# Patient Record
Sex: Female | Born: 1980 | Race: White | Hispanic: No | Marital: Married | Smoking: Never smoker
Health system: Southern US, Community
[De-identification: ages and names within clinical notes are randomized; demographics above are authoritative.]

## PROBLEM LIST (undated history)

## (undated) ENCOUNTER — Inpatient Hospital Stay (HOSPITAL_COMMUNITY): Payer: Self-pay

## (undated) ENCOUNTER — Emergency Department: Admission: EM | Payer: 59 | Source: Home / Self Care

## (undated) DIAGNOSIS — F329 Major depressive disorder, single episode, unspecified: Secondary | ICD-10-CM

## (undated) DIAGNOSIS — J45909 Unspecified asthma, uncomplicated: Secondary | ICD-10-CM

## (undated) DIAGNOSIS — R87629 Unspecified abnormal cytological findings in specimens from vagina: Secondary | ICD-10-CM

## (undated) DIAGNOSIS — G43909 Migraine, unspecified, not intractable, without status migrainosus: Secondary | ICD-10-CM

## (undated) DIAGNOSIS — F32A Depression, unspecified: Secondary | ICD-10-CM

## (undated) DIAGNOSIS — IMO0002 Reserved for concepts with insufficient information to code with codable children: Secondary | ICD-10-CM

## (undated) DIAGNOSIS — K219 Gastro-esophageal reflux disease without esophagitis: Secondary | ICD-10-CM

## (undated) HISTORY — PX: NASAL SINUS SURGERY: SHX719

## (undated) HISTORY — DX: Gastro-esophageal reflux disease without esophagitis: K21.9

## (undated) HISTORY — DX: Migraine, unspecified, not intractable, without status migrainosus: G43.909

## (undated) HISTORY — DX: Reserved for concepts with insufficient information to code with codable children: IMO0002

## (undated) HISTORY — DX: Depression, unspecified: F32.A

## (undated) HISTORY — DX: Major depressive disorder, single episode, unspecified: F32.9

---
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
## 2000-07-09 ENCOUNTER — Encounter: Payer: Self-pay | Admitting: Family Medicine

## 2000-07-09 ENCOUNTER — Encounter: Admission: RE | Admit: 2000-07-09 | Discharge: 2000-07-09 | Payer: Self-pay | Admitting: Family Medicine

## 2000-08-15 ENCOUNTER — Other Ambulatory Visit: Admission: RE | Admit: 2000-08-15 | Discharge: 2000-08-15 | Payer: Self-pay | Admitting: Family Medicine

## 2000-11-01 ENCOUNTER — Other Ambulatory Visit: Admission: RE | Admit: 2000-11-01 | Discharge: 2000-11-01 | Payer: Self-pay | Admitting: Gynecology

## 2001-02-19 ENCOUNTER — Inpatient Hospital Stay (HOSPITAL_COMMUNITY): Admission: AD | Admit: 2001-02-19 | Discharge: 2001-02-19 | Payer: Self-pay | Admitting: Gynecology

## 2001-05-02 ENCOUNTER — Encounter (INDEPENDENT_AMBULATORY_CARE_PROVIDER_SITE_OTHER): Payer: Self-pay

## 2001-05-02 ENCOUNTER — Inpatient Hospital Stay (HOSPITAL_COMMUNITY): Admission: AD | Admit: 2001-05-02 | Discharge: 2001-05-04 | Payer: Self-pay | Admitting: Gynecology

## 2001-05-05 ENCOUNTER — Encounter: Admission: RE | Admit: 2001-05-05 | Discharge: 2001-05-08 | Payer: Self-pay | Admitting: Gynecology

## 2001-05-13 ENCOUNTER — Encounter: Admission: RE | Admit: 2001-05-13 | Discharge: 2001-06-12 | Payer: Self-pay | Admitting: Gynecology

## 2001-06-12 ENCOUNTER — Other Ambulatory Visit: Admission: RE | Admit: 2001-06-12 | Discharge: 2001-06-12 | Payer: Self-pay | Admitting: Gynecology

## 2002-03-04 ENCOUNTER — Encounter: Payer: Self-pay | Admitting: Emergency Medicine

## 2002-03-04 ENCOUNTER — Emergency Department (HOSPITAL_COMMUNITY): Admission: EM | Admit: 2002-03-04 | Discharge: 2002-03-04 | Payer: Self-pay | Admitting: Emergency Medicine

## 2002-07-09 ENCOUNTER — Other Ambulatory Visit: Admission: RE | Admit: 2002-07-09 | Discharge: 2002-07-09 | Payer: Self-pay | Admitting: Gynecology

## 2002-11-12 ENCOUNTER — Other Ambulatory Visit: Admission: RE | Admit: 2002-11-12 | Discharge: 2002-11-12 | Payer: Self-pay | Admitting: Gynecology

## 2002-11-28 ENCOUNTER — Observation Stay (HOSPITAL_COMMUNITY): Admission: AD | Admit: 2002-11-28 | Discharge: 2002-11-28 | Payer: Self-pay | Admitting: Gynecology

## 2002-12-04 ENCOUNTER — Encounter: Admission: RE | Admit: 2002-12-04 | Discharge: 2003-03-04 | Payer: Self-pay | Admitting: Gynecology

## 2003-05-03 ENCOUNTER — Inpatient Hospital Stay (HOSPITAL_COMMUNITY): Admission: AD | Admit: 2003-05-03 | Discharge: 2003-05-03 | Payer: Self-pay | Admitting: Gynecology

## 2003-05-04 ENCOUNTER — Inpatient Hospital Stay (HOSPITAL_COMMUNITY): Admission: AD | Admit: 2003-05-04 | Discharge: 2003-05-04 | Payer: Self-pay | Admitting: Gynecology

## 2003-05-15 ENCOUNTER — Inpatient Hospital Stay (HOSPITAL_COMMUNITY): Admission: AD | Admit: 2003-05-15 | Discharge: 2003-05-15 | Payer: Self-pay | Admitting: Gynecology

## 2003-05-18 ENCOUNTER — Observation Stay (HOSPITAL_COMMUNITY): Admission: AD | Admit: 2003-05-18 | Discharge: 2003-05-18 | Payer: Self-pay | Admitting: Internal Medicine

## 2003-05-28 ENCOUNTER — Inpatient Hospital Stay (HOSPITAL_COMMUNITY): Admission: AD | Admit: 2003-05-28 | Discharge: 2003-05-30 | Payer: Self-pay | Admitting: Gynecology

## 2003-07-09 ENCOUNTER — Other Ambulatory Visit: Admission: RE | Admit: 2003-07-09 | Discharge: 2003-07-09 | Payer: Self-pay | Admitting: Gynecology

## 2003-07-21 ENCOUNTER — Emergency Department (HOSPITAL_COMMUNITY): Admission: EM | Admit: 2003-07-21 | Discharge: 2003-07-22 | Payer: Self-pay | Admitting: Emergency Medicine

## 2003-10-19 ENCOUNTER — Ambulatory Visit (HOSPITAL_BASED_OUTPATIENT_CLINIC_OR_DEPARTMENT_OTHER): Admission: RE | Admit: 2003-10-19 | Discharge: 2003-10-19 | Payer: Self-pay | Admitting: Otolaryngology

## 2003-10-19 ENCOUNTER — Ambulatory Visit (HOSPITAL_COMMUNITY): Admission: RE | Admit: 2003-10-19 | Discharge: 2003-10-19 | Payer: Self-pay | Admitting: Otolaryngology

## 2003-10-19 ENCOUNTER — Encounter (INDEPENDENT_AMBULATORY_CARE_PROVIDER_SITE_OTHER): Payer: Self-pay | Admitting: Specialist

## 2003-12-19 HISTORY — PX: OTHER SURGICAL HISTORY: SHX169

## 2004-03-22 ENCOUNTER — Ambulatory Visit (HOSPITAL_COMMUNITY): Admission: RE | Admit: 2004-03-22 | Discharge: 2004-03-22 | Payer: Self-pay | Admitting: Gastroenterology

## 2004-05-31 ENCOUNTER — Ambulatory Visit (HOSPITAL_COMMUNITY): Admission: RE | Admit: 2004-05-31 | Discharge: 2004-05-31 | Payer: Self-pay | Admitting: Gastroenterology

## 2004-07-06 ENCOUNTER — Encounter: Admission: RE | Admit: 2004-07-06 | Discharge: 2004-07-06 | Payer: Self-pay | Admitting: General Surgery

## 2004-07-15 ENCOUNTER — Inpatient Hospital Stay (HOSPITAL_COMMUNITY): Admission: EM | Admit: 2004-07-15 | Discharge: 2004-07-18 | Payer: Self-pay | Admitting: Emergency Medicine

## 2004-07-21 ENCOUNTER — Other Ambulatory Visit: Admission: RE | Admit: 2004-07-21 | Discharge: 2004-07-21 | Payer: Self-pay | Admitting: Gynecology

## 2004-08-11 ENCOUNTER — Inpatient Hospital Stay (HOSPITAL_COMMUNITY): Admission: RE | Admit: 2004-08-11 | Discharge: 2004-08-13 | Payer: Self-pay | Admitting: General Surgery

## 2004-08-18 DIAGNOSIS — IMO0002 Reserved for concepts with insufficient information to code with codable children: Secondary | ICD-10-CM

## 2004-08-18 HISTORY — DX: Reserved for concepts with insufficient information to code with codable children: IMO0002

## 2004-09-12 ENCOUNTER — Emergency Department (HOSPITAL_COMMUNITY): Admission: EM | Admit: 2004-09-12 | Discharge: 2004-09-12 | Payer: Self-pay | Admitting: Emergency Medicine

## 2004-09-14 ENCOUNTER — Encounter: Admission: RE | Admit: 2004-09-14 | Discharge: 2004-09-14 | Payer: Self-pay | Admitting: General Surgery

## 2004-11-22 ENCOUNTER — Ambulatory Visit: Payer: Self-pay | Admitting: Critical Care Medicine

## 2004-12-07 ENCOUNTER — Ambulatory Visit: Payer: Self-pay | Admitting: Pulmonary Disease

## 2004-12-23 ENCOUNTER — Other Ambulatory Visit: Admission: RE | Admit: 2004-12-23 | Discharge: 2004-12-23 | Payer: Self-pay | Admitting: Gynecology

## 2005-01-18 ENCOUNTER — Ambulatory Visit: Payer: Self-pay | Admitting: Internal Medicine

## 2005-01-26 ENCOUNTER — Ambulatory Visit: Payer: Self-pay | Admitting: Adult Health

## 2005-03-20 ENCOUNTER — Ambulatory Visit: Payer: Self-pay | Admitting: Internal Medicine

## 2005-03-21 ENCOUNTER — Emergency Department (HOSPITAL_COMMUNITY): Admission: EM | Admit: 2005-03-21 | Discharge: 2005-03-21 | Payer: Self-pay | Admitting: Emergency Medicine

## 2005-03-28 ENCOUNTER — Encounter: Admission: RE | Admit: 2005-03-28 | Discharge: 2005-03-28 | Payer: Self-pay | Admitting: General Surgery

## 2005-04-13 ENCOUNTER — Ambulatory Visit: Payer: Self-pay | Admitting: Professional

## 2005-04-20 ENCOUNTER — Ambulatory Visit: Payer: Self-pay | Admitting: Professional

## 2005-04-27 ENCOUNTER — Ambulatory Visit: Payer: Self-pay | Admitting: Professional

## 2005-05-04 ENCOUNTER — Ambulatory Visit: Payer: Self-pay | Admitting: Professional

## 2005-05-18 ENCOUNTER — Ambulatory Visit: Payer: Self-pay | Admitting: Professional

## 2005-05-24 ENCOUNTER — Ambulatory Visit: Payer: Self-pay | Admitting: Pulmonary Disease

## 2005-05-25 ENCOUNTER — Ambulatory Visit: Payer: Self-pay | Admitting: Professional

## 2005-06-08 ENCOUNTER — Ambulatory Visit: Payer: Self-pay | Admitting: Professional

## 2005-06-15 ENCOUNTER — Ambulatory Visit: Payer: Self-pay | Admitting: Professional

## 2005-06-16 ENCOUNTER — Other Ambulatory Visit: Admission: RE | Admit: 2005-06-16 | Discharge: 2005-06-16 | Payer: Self-pay | Admitting: Gynecology

## 2005-06-22 ENCOUNTER — Ambulatory Visit: Payer: Self-pay | Admitting: Professional

## 2005-06-26 ENCOUNTER — Ambulatory Visit: Payer: Self-pay | Admitting: Professional

## 2005-06-29 ENCOUNTER — Ambulatory Visit: Payer: Self-pay | Admitting: Professional

## 2005-07-13 ENCOUNTER — Ambulatory Visit: Payer: Self-pay | Admitting: Professional

## 2005-07-20 ENCOUNTER — Ambulatory Visit: Payer: Self-pay | Admitting: Professional

## 2005-07-22 ENCOUNTER — Emergency Department (HOSPITAL_COMMUNITY): Admission: EM | Admit: 2005-07-22 | Discharge: 2005-07-22 | Payer: Self-pay | Admitting: Emergency Medicine

## 2005-07-27 ENCOUNTER — Ambulatory Visit: Payer: Self-pay | Admitting: Professional

## 2005-07-31 ENCOUNTER — Ambulatory Visit (HOSPITAL_COMMUNITY): Admission: RE | Admit: 2005-07-31 | Discharge: 2005-07-31 | Payer: Self-pay | Admitting: Family Medicine

## 2005-08-03 ENCOUNTER — Ambulatory Visit: Payer: Self-pay | Admitting: Professional

## 2005-08-09 ENCOUNTER — Ambulatory Visit: Payer: Self-pay | Admitting: Pulmonary Disease

## 2005-08-17 ENCOUNTER — Ambulatory Visit: Payer: Self-pay | Admitting: Professional

## 2005-08-24 ENCOUNTER — Ambulatory Visit: Payer: Self-pay | Admitting: Professional

## 2005-08-31 ENCOUNTER — Ambulatory Visit: Payer: Self-pay | Admitting: Professional

## 2005-09-07 ENCOUNTER — Ambulatory Visit: Payer: Self-pay | Admitting: Professional

## 2005-10-23 ENCOUNTER — Emergency Department (HOSPITAL_COMMUNITY): Admission: EM | Admit: 2005-10-23 | Discharge: 2005-10-23 | Payer: Self-pay | Admitting: *Deleted

## 2006-08-08 ENCOUNTER — Emergency Department (HOSPITAL_COMMUNITY): Admission: EM | Admit: 2006-08-08 | Discharge: 2006-08-08 | Payer: Self-pay | Admitting: Emergency Medicine

## 2006-11-11 ENCOUNTER — Emergency Department (HOSPITAL_COMMUNITY): Admission: EM | Admit: 2006-11-11 | Discharge: 2006-11-11 | Payer: Self-pay | Admitting: Emergency Medicine

## 2006-11-12 ENCOUNTER — Emergency Department (HOSPITAL_COMMUNITY): Admission: EM | Admit: 2006-11-12 | Discharge: 2006-11-12 | Payer: Self-pay | Admitting: Emergency Medicine

## 2008-02-25 ENCOUNTER — Other Ambulatory Visit: Admission: RE | Admit: 2008-02-25 | Discharge: 2008-02-25 | Payer: Self-pay | Admitting: Gynecology

## 2008-03-13 ENCOUNTER — Emergency Department (HOSPITAL_COMMUNITY): Admission: EM | Admit: 2008-03-13 | Discharge: 2008-03-13 | Payer: Self-pay | Admitting: Emergency Medicine

## 2008-10-07 ENCOUNTER — Emergency Department: Payer: Self-pay | Admitting: Emergency Medicine

## 2008-12-11 ENCOUNTER — Emergency Department (HOSPITAL_COMMUNITY): Admission: EM | Admit: 2008-12-11 | Discharge: 2008-12-11 | Payer: Self-pay | Admitting: Emergency Medicine

## 2009-04-22 ENCOUNTER — Other Ambulatory Visit: Admission: RE | Admit: 2009-04-22 | Discharge: 2009-04-22 | Payer: Self-pay | Admitting: Gynecology

## 2009-04-22 ENCOUNTER — Encounter: Payer: Self-pay | Admitting: Gynecology

## 2009-04-22 ENCOUNTER — Ambulatory Visit: Payer: Self-pay | Admitting: Gynecology

## 2009-05-28 ENCOUNTER — Emergency Department (HOSPITAL_COMMUNITY): Admission: EM | Admit: 2009-05-28 | Discharge: 2009-05-29 | Payer: Self-pay | Admitting: Emergency Medicine

## 2009-07-13 ENCOUNTER — Ambulatory Visit: Payer: Self-pay | Admitting: Gynecology

## 2009-07-19 ENCOUNTER — Ambulatory Visit: Payer: Self-pay | Admitting: Gynecology

## 2009-11-16 ENCOUNTER — Ambulatory Visit: Payer: Self-pay | Admitting: Gynecology

## 2010-04-04 ENCOUNTER — Emergency Department (HOSPITAL_COMMUNITY): Admission: EM | Admit: 2010-04-04 | Discharge: 2010-04-04 | Payer: Self-pay | Admitting: Emergency Medicine

## 2011-01-08 ENCOUNTER — Encounter: Payer: Self-pay | Admitting: General Surgery

## 2011-04-22 ENCOUNTER — Emergency Department (HOSPITAL_COMMUNITY)
Admission: EM | Admit: 2011-04-22 | Discharge: 2011-04-22 | Disposition: A | Discharge status: Home / Self Care | Payer: BC Managed Care – PPO | Attending: Emergency Medicine | Admitting: Emergency Medicine

## 2011-04-22 DIAGNOSIS — R51 Headache: Secondary | ICD-10-CM | POA: Insufficient documentation

## 2011-04-22 DIAGNOSIS — J029 Acute pharyngitis, unspecified: Secondary | ICD-10-CM | POA: Insufficient documentation

## 2011-04-22 DIAGNOSIS — J45909 Unspecified asthma, uncomplicated: Secondary | ICD-10-CM | POA: Insufficient documentation

## 2011-04-22 DIAGNOSIS — I7300 Raynaud's syndrome without gangrene: Secondary | ICD-10-CM | POA: Insufficient documentation

## 2011-05-05 NOTE — Op Note (Signed)
NAME:  [name], BERGEMANN                        ACCOUNT NO.:  [id number]   MEDICAL RECORD NO.:  [id number]                   PATIENT TYPE:  AMB   LOCATION:  ENDO                                 FACILITY:  [facility]   PHYSICIAN:  James L. Malon Kindle., M.D.          DATE OF BIRTH:  03-31-1981   DATE OF PROCEDURE:  03/22/2004  DATE OF DISCHARGE:                                 OPERATIVE REPORT   PROCEDURE PERFORMED:  Esophagogastroduodenoscopy.   MEDICATIONS:  Cetacaine spray, fentanyl 75 mcg, Versed 6 mg IV.   ENDOSCOPIST:  Llana Aliment. Randa Evens, M.D.   INDICATIONS FOR PROCEDURE:  Persistent esophageal reflux symptoms as well as  sinus drainage.  She had had previous surgery.  She had a pH probe that  showed some increased reflux episodes with a DeMeester score of 11.  This  was done while on proton pump inhibitors.  She has continued postnasal drip  and some heartburn.  She has been on Prilosec since she was 14.  This is  done in anticipation of possible consideration of an antireflux procedure.   DESCRIPTION OF PROCEDURE:  The procedure had been explained to the patient  and consent obtained.  With the patient in left lateral decubitus position,  the Olympus scope was inserted and advanced.  The stomach was entered.  The  pylorus identified and passed.  The duodenum including the bulb and second  portion were seen well and were unremarkable.  The scope was withdrawn back  into the stomach.  The  pyloric channel, antrum and body of the stomach were  normal.  The fundus and cardia were seen well on the retroflex view and were  normal.  The diaphragmatic hiatus was seen right at the gastroesophageal  junction.  There was no significant hiatal hernia but a widely patent GE  junction.  This was located at 39 cm from the teeth.  The distal and  proximal esophagus were endoscopically normal.  The scope was withdrawn.  The patient tolerated the procedure well.   ASSESSMENT:  Probable  gastroesophageal reflux disease, 530.81.  Unclear if  this is causing all of her symptoms or some of them are due to allergies.   PLAN:  Will continue to treat aggressively for reflux with twice daily  proton pump inhibitors, give reflux instruction sheet and see back in the  office in six to eight weeks and discuss the alternatives with her.                                               James L. Malon Kindle., M.D.    Waldron Session  D:  03/22/2004  T:  03/23/2004  Job:  [id number]   cc:   Duwayne Heck L. Mahaffey, M.D.  [address]  [place]  [place] [address]  Fax: [phone]  Jessica Priest, M.D.  [address]  [place] [address]  Fax: [phone]   Jefry H. Pollyann Kennedy, M.D.  [address] [place]  [place] [address]  Fax: [phone]

## 2011-05-05 NOTE — Consult Note (Signed)
   NAMEHELMI, [name]                        ACCOUNT NO.:  [id number]   MEDICAL RECORD NO.:  [id number]                   PATIENT TYPE:  INP   LOCATION:  9163                                 [facility]   PHYSICIAN:  Juan H. Lily Peer, M.D.             DATE OF BIRTH:  [birth year]   DATE OF CONSULTATION:  05/18/2003  DATE OF DISCHARGE:  05/18/2003                                   CONSULTATION   HISTORY OF PRESENT ILLNESS:  The patient is a 30 year old gravida 2, para 1,  with an estimated date of confinement of June __________ , 2004, currently  [redacted] weeks gestation, who presented to the [facility] early this morning  after midnight complaining of contractions.  They were every four to five  minutes apart for several hours.  She was placed on a monitor and was found  to be contracting every five to 10 minutes apart, and it was felt that she  was very uncomfortable, a reassuring fetal heart rate tracing.  She stated  that in the office she had been told that her cervix had been 2 cm, 50%  effaced, and at a -3 station.   She was brought in and was allowed to ambulate.  She was monitored  throughout the night with contractions __________ subsided and were very  infrequent and mild, one every 10-15 minutes apart with a reassuring fetal  heart rate tracing.   PHYSICAL EXAMINATION:  VITAL SIGNS:  Her vital signs were as follows:  Blood  pressure 150/80, pulse 83, temperature 98.4 degrees, respirations 20.  PELVIC:  The cervix was long and closed and posterior.  She has had no risk  factors reported during this pregnancy.   DISPOSITION:  She is scheduled to come to the office next Wednesday for her  routine OB visit.  Labor instructions were provided, if the contractions  were to pick up and increase in frequency, or if she had ruptured membranes,  she will report immediately to the hospital.                                                Gaetano Hawthorne. Lily Peer, M.D.    JHF/MEDQ  D:  05/18/2003  T:  05/18/2003  Job:  [id number]

## 2011-05-05 NOTE — Discharge Summary (Signed)
[facility]  Patient:    [name], [name]                     MRN: [id number] Adm. Date:  [id number] Disc. Date: [id number] Attending:  Tonye Royalty Dictator:   Antony Contras, N.P.                           Discharge Summary  DISCHARGE DIAGNOSES:          Intrauterine pregnancy at 64 2/7 weeks, PPROM, history of recurrent urinary tract infections during current pregnancy.  PROCEDURE:                    Normal spontaneous vaginal delivery of viable infant over intact perineum.  HISTORY OF PRESENT ILLNESS:   Patient is an 30 year old prima gravida with questionable LMP, [facility] May 28, 2001 by ultrasound.  Prenatal course was complicated by recurrent urinary tract infections for which she was appropriately treated.  PRENATAL LABORATORIES:        Blood type A+.  Antibody screen negative.  RPR, HBSAG, HIV nonreactive.  HOSPITAL COURSE:              Patient presented on May 02, 2001 with spontaneous rupture of membranes at 36 2/7 weeks.  Cervix was 3 cm, 80%, and -2 station.  She also did have some elevated blood pressures on admission which did normalize after her epidural anesthesia was administered.  She did progress to complete dilatation and was delivered of an Apgar 9/10 female infant weighing 5 pounds 12 ounces over an intact perineum.  Postpartum she remained afebrile, had no difficulty voiding, and was able to be discharged in satisfactory condition on her second postpartum day.  LABORATORIES:                 CBC:  Hematocrit 32.1, hemoglobin 10.7, WBC 13.3, platelets 21.5.  Urine culture was also obtained prior to discharge.  DISPOSITION:                  Follow-up in six weeks.  Continue prenatal vitamins and iron.  Motrin and Tylox for pain. DD:  05/20/01 TD:  05/21/01 Job: [id number]

## 2011-05-05 NOTE — Op Note (Signed)
NAME:  [name], [name]                        ACCOUNT NO.:  [id number]   MEDICAL RECORD NO.:  [id number]                   PATIENT TYPE:  AMB   LOCATION:  ENDO                                 FACILITY:  [facility]   PHYSICIAN:  James L. Malon Kindle., M.D.          DATE OF BIRTH:  Apr 09, 1981   DATE OF PROCEDURE:  05/31/2004  DATE OF DISCHARGE:  05/31/2004                                 OPERATIVE REPORT   PROCEDURE:  Esophageal manometry.   INDICATION:  Procedure is done to evaluate for possible antireflux  procedure.  Patient has had previous endoscopy and had a 24-hour probe with  a normal amount of reflux but the probe was done while on medications.  This  was in ear, nose and throat office.  Their interpretation was that there was  reflux in the proximal probe which was abnormal.   DESCRIPTION OF PROCEDURE:  The procedure was performed in the [facility]  manometry lab in the usual manner, no provocative studies were performed.  Results are as follows.  1. LES appears to relax nearly to the baseline with swallowing as only     residual pressure of 7.9 mm, which is normal, although the percent     relaxation was listed at 53%.  Mean LES pressure was 21.5, which is     normal.  2.  Esophageal body mean amplitude in the distal esophagus was     124 mm, mean duration of 2.7 seconds.  All wet swallows presented for     evaluation showed normal peristalsis.  2. Upper esophageal sphincter relaxes with swallow.   ASSESSMENT:  Essentially normal manometry.   PLAN:  The patient will see surgeon to discuss possible antireflux operation  with her.                                               James L. Malon Kindle., M.D.    Waldron Session  D:  07/02/2004  T:  07/03/2004  Job:  [id number]   cc:   Enrigue Catena H. Pollyann Kennedy, M.D.  [address] [place]  [place] [address]  Fax: [phone]   Jessica Priest, M.D.  [address]  [place] [address]  Fax: [phone]   [facility]  [place]  [address]  [place], [place] [address]   Angelia Mould. Derrell Lolling, M.D.  [address]  [place]  [place] [address]  Fax: [phone]

## 2011-05-05 NOTE — Discharge Summary (Signed)
   NAMEKINDRED, HEYING                        ACCOUNT NO.:  [id number]   MEDICAL RECORD NO.:  [id number]                   PATIENT TYPE:  INP   LOCATION:  9109                                 [facility]   PHYSICIAN:  Ivor Costa. Farrel Gobble, M.D.              DATE OF BIRTH:  1981/11/14   DATE OF ADMISSION:  05/28/2003  DATE OF DISCHARGE:  05/30/2003                                 DISCHARGE SUMMARY   DISCHARGE DIAGNOSIS:  Intrauterine pregnancy at 39 weeks with spontaneous  rupture of membranes.   PROCEDURE:  Vaginal delivery with a viable infant with no episiotomy.   HISTORY OF PRESENT ILLNESS:  A 30 year old gravida 2, para 1 at 45 weeks  with spontaneous rupture of membranes who presented in labor at 2 cm.  She  was augmented with Pitocin.  She had no prenatal complications.   PRENATAL LABORATORIES:  A positive, antibody negative, serology nonreactive,  Rubella titer positive, hepatitis nonreactive, HIV nonreactive, and group B  Streptococcus negative.   HOSPITAL COURSE AND TREATMENT:  She presented in labor on May 28, 2003,  with spontaneous rupture of membranes, clear fluid, was augmented with  Pitocin, delivered a viable female with Apgars of 8 and 9, birth weight of 6  pounds 10 ounces, cord pH of 7.18.  Postpartum course, she remained  afebrile, voiding, lochia decreasing.  She was discharged home in  satisfactory condition on her second postpartum day.   DISCHARGE LABORATORIES:  White count 9.1, hemoglobin 10.5, hematocrit 31.8,  platelets 168.   DISPOSITION:  She was discharged to home with instructions to follow up in  six weeks or as needed, continue prenatal vitamins, ________ discharge  booklet, and a prescription for Vioxx 25 mg daily was given.     Davonna Belling. Young, N.P.                      Ivor Costa. Farrel Gobble, M.D.    Providence Lanius  D:  06/18/2003  T:  06/18/2003  Job:  [id number]

## 2011-05-05 NOTE — H&P (Signed)
[facility]  Patient:    [name], [name]                     MRN: [id number] Adm. Date:  [id number] Attending:  Tonye Royalty Dictator:   Gaetano Hawthorne. Lily Peer, M.D.                         History and Physical  CHIEF COMPLAINT:              Preterm premature rupture of membranes at 36-2/7ths weeks gestation.  HISTORY:                      The patient is an 30 year old gravida 1, para 0, with a corrected estimated date of confinement of June 11, based on an early ultrasound, who presented to [facility] this morning stating she had the spontaneous rupture of membranes between 0330 and 0400 hours today.  On admission her temperature was 97.5 and there was evidence of gross rupture of membranes and her cervix was 2 cm, 80% and -2.  Her blood pressure initially was 144/101, repeat was 133/82 and 135/76, respectively; respiration was 20 and pulse was 106 and then 101.  Fetal heart rates running in the 120-130 beat per minute range with a reassuring fetal heart rate tracing with contractions every 2-4 minutes apart.  Once she was admitted to labor and delivery her blood pressure on arrival to the floor was 141/101, but later when she was at rest her blood pressure came down to 135/65.  She also had 2+ pitting edema but no clonus, no right upper quadrant pain, no visual disturbances or unusual headaches.  The patients prenatal course otherwise had been significant for recurring urinary tract infections for which she was treated appropriately.  PAST MEDICAL HISTORY:         The patient denies any allergies.  She has been on oral contraceptive pills prior to this pregnancy.  She has had a history of clinical depression in the past.  No other medical problems reported.  REVIEW OF SYSTEMS:            See [place] form.  PHYSICAL EXAMINATION:  VITAL SIGNS:                  As mentioned above.  HEENT:                         Unremarkable.  NECK:                         Supple, trachea midline.  No carotid bruits or thyromegaly.  LUNGS:                        Clear to auscultation without rhonchi, rales or wheezes.  HEART:                        Regular rate and rhythm, no murmurs or gallops.  BREASTS:                      Breast exam done during the first trimester was reported to be normal.  ABDOMEN:                      Gravid uterus, vertex presentation by Thayer Ohm  maneuver, positive fetal heart tones.  PELVIC:                       Cervix 3-4 cm, 80% effaced, -2 station with gross rupture of membranes with clear fluid.  EXTREMITIES:                  DTRs 2+, negative clonus, 2+ pitting edema.  PRENATAL LABORATORIES:        Only lab data available from the office is a blood type of A positive, negative antibody screen, VDRL was nonreactive, rubella was immune, hepatitis B surface and HIV were negative.  ADMITTING LABORATORIES:       Chemistry: Hemoglobin and hematocrit of 11.6 and 35.2, respectively, with a platelet count of 212,000.  Her LDH, uric acid and comprehensive metabolic panel were all normal.  Her urine did demonstrate evidence of proteinuria.  ASSESSMENT:                   An 30 year old gravida 1, para 0, at 36-2/7ths weeks, with preterm premature rupture of membranes at approximately 0400 hours.  Her cervix is 3-4 cm dilated, 80% effaced, a -2 station with irregular contractions, reassuring fetal heart rate tracing.  The patient with GBS culture negative.  The patient will have an epidural placed and once this is in place, will proceed then with Pitocin augmentation.  Will continue to monitor her blood pressures closely; if any signs of persistent elevation, will initiate magnesium sulfate at that time.  PLAN:                         1. Anticipate a vaginal delivery.                               2. As per assessment above. DD:  05/02/01 TD:  05/02/01 Job: [id number]

## 2011-05-05 NOTE — Op Note (Signed)
[name], ALTLAND                        ACCOUNT NO.:  [id number]   MEDICAL RECORD NO.:  [id number]                   PATIENT TYPE:  OBV   LOCATION:  0344                                 [facility]   PHYSICIAN:  Angelia Mould. Derrell Lolling, M.D.             DATE OF BIRTH:  [birth year]   DATE OF PROCEDURE:  08/10/2004  DATE OF DISCHARGE:                                 OPERATIVE REPORT   PREOPERATIVE DIAGNOSIS:  Gastroesophageal reflux disease.   POSTOPERATIVE DIAGNOSIS:  Gastroesophageal reflux disease.   OPERATION PERFORMED:  Laparoscopic Nissen fundoplication.   SURGEON:  Dr. Claud Kelp   FIRST ASSISTANT:  Dr. Luretha Murphy   OPERATIVE INDICATIONS:  This is a 30 year old white female, who has had  reflux symptoms for greater than 10 years.  She has been on prescription  medications since age 47.  She continues to have substernal burning  discomfort, bloating, sore throats, and voice change.  She had remained  symptomatic despite double-dose proton pump inhibitors and Reglan.  She had  a 24-hour pH probe while on proton pump inhibitors and had numerous reflux  episodes but because she was on proton pump inhibitors, the DeMeester score  was only 11.5.  She had upper endoscopy which showed a normal esophagus,  stomach, and duodenum but GE junction was very widely patent.  She had a  manometry which showed normal peristalsis and good relaxation, and she had  an upper GI which showed very minimal hiatal hernia but some reflux.  She  was felt to be a good candidate for an elective Nissen fundoplication.  That  was offered to her, and she wanted to have that done at this time.  She is  brought to the operating room electively.   OPERATIVE TECHNIQUE:  Following the induction of general endotracheal  anesthesia, the patient's abdomen was prepped and draped in a sterile  fashion.  A 10 mm OptiView port was placed in the right rectus sheath about  2 cm above the umbilicus.  This  was an atraumatic entry, and a  pneumoperitoneum was created.  Visualization was carried out.  The liver  looked normal.  The stomach and esophageal hiatus looked normal.  The spleen  looked normal.  The small bowel and large bowel looked normal.  The  gallbladder looked normal.   A 5 mm trocar was placed in the subxiphoid region, and we inserted a liver  retractor and held up the left lobe of the liver, and this provided good  visualization.  The 10 mm trocar was placed in the left subcostal region,  and two 10 mm trocars were placed in the right upper quadrant.  We elevated  the cardia of the stomach and dissected the gastrohepatic omentum away using  the harmonic scalpel.  We dissected the areolar tissue off of the anterior  surface of the esophagus and took down some of the peritoneal attachments on  the  left side of the left crus as well.  We then dissected the right crus  away from the esophagus using blunt dissection and the harmonic scalpel, and  that went well.  We had good visualization of the posterior vagus nerve and  the esophagus, and we were able to lift these up and create a window.  We  then took down some of the short gastric vessels on the upper part of the  greater curvature of the stomach and the fundus.  We had one bleeding vessel  off of the stomach, one of the short gastric vessels caused some bleeding,  and we probably lost 30-40 mL of blood up there but controlled this with  metal clips and Surgicel and pressure.  At the completion of the case, this  was completely hemostatic.  We continued to dissect the fundus of the  stomach completely away from the left crus and had excellent mobilization.  Once we had checked for bleeding around the hilum and spleen, everything  looked fine.  We did note that a small tip of the upper pole of the spleen  looked like it had infarcted.  We did not consider this to be a serious  problem.   We had the right and left crura thus  completely dissected out.  We closed  the crura with two interrupted sutures of 0 [place].  We then had Dr.  Rica Mast pass a #50 [place] lighted Bougie through the esophagus into the  stomach.  That was atraumatic.  The esophagus was small, and we did not  think it would tolerate a large Bougie.  We then passed the fundus of the  stomach, behind the esophagus from left to right and then found a suitable  piece of fundus on the left side and found that we could do a very loose  floppy fundoplication.  We pulled the Bougie back a little bit to allow  suturing of the esophagus.  The fundoplication was created with three  interrupted sutures of 0 [place], placed about 1 cm apart.  Each of these  sutures took a full-thickness bite of fundus on the left, small bite of  esophagus anteriorly, and a full-thickness bite of fundus on the right.  These were individually placed, tied, marked with metal clips, and cut.  After the completion of this, the fundoplication again looked quite loose,  and the dilator was removed.  We checked for bleeding everywhere, and  everything was completely hemostatic.  We took photographs of the crural  closure and the fundoplication.  The trocars were removed under direct  vision.  There was no bleeding from the trocar sites.  The pneumoperitoneum  was released.  Skin incisions were closed with skin staples and Steri-  Strips.  Clean bandages were placed and the patient taken to recovery room  in stable condition.  Estimated blood loss was about 40-50 mL.  Complications were none.  Sponge, needle, and instrument counts were  correct.                                               Angelia Mould. Derrell Lolling, M.D.    HMI/MEDQ  D:  08/10/2004  T:  08/10/2004  Job:  [id number]   cc:   Fayrene Fearing L. Malon Kindle., M.D.  [address]  [place]  [place] [address]  Fax:  [phone]   Danielle L. Mahaffey, M.D.  [address]  [place]  [place] [address] Fax: [phone]   Jeannett Senior. Pollyann Kennedy, M.D.  [address] [place]  [place] [address]  Fax: [phone]   Jessica Priest, M.D.  [address]  [place] [address]  Fax: [phone]

## 2011-05-05 NOTE — Discharge Summary (Signed)
NAMEJORDY, [name]                        ACCOUNT NO.:  [id number]   MEDICAL RECORD NO.:  [id number]                   PATIENT TYPE:  INP   LOCATION:  0349                                 FACILITY:  [facility]   PHYSICIAN:  Corinna L. Lendell Caprice, MD             DATE OF BIRTH:  February 08, 1981   DATE OF ADMISSION:  07/15/2004  DATE OF DISCHARGE:  07/18/2004                                 DISCHARGE SUMMARY   DIAGNOSES:  1. Viral meningitis.  2. Chronic urticaria.  3. Raynaud's disease.  4. Severe gastroesophageal reflux disease.   DISCHARGE MEDICATIONS:  1. Continue her home medications, please see H&P for details.  2. May take Vicodin 1-2 p.o. q.4h p.r.n. pain or Tylenol p.r.n.  3. Phenergan 12.5 mg p.o. q.6h p.r.n. pain.   ACTIVITY:  She may return to class and work in a week, otherwise ad lib.   DIET:  No restrictions on diet.   CONDITION ON DISCHARGE:  Stable.   FOLLOW UP:  With primary care physician as needed.   CONSULTATIONS:  None.   PROCEDURE:  Lumbar puncture.   LABORATORY DATA:  IGG was detected, no IGM.  EBV was detected. Mono screen  negative.  Urine culture negative.  CSF culture negative.  Urinalysis:  greater than 80 ketones, negative nitrite, negative leukocyte esterase,  negative blood.  CSF protein was 27 and CSF glucose was 56.  Cell count on  the CSF showed 11 white cells with 46% neutrophils, 51% lymphocytes.  CBC  was unremarkable.  Complete metabolic panel unremarkable.  CT of head was  negative.   HOSPITAL COURSE:  [name] is a 30 year old white female who presented to  the emergency room with the worst headache of her life.  She had been having  low-grade fevers for several days.  She had neck pain, chills, myalgias,  nausea, sore throat and her child was ill.  She had a lumbar puncture done  under fluoroscopic guidance and was felt to have viral meningitis.  She was  vomiting and in a lot of pain and was therefore admitted.  She was  given several  doses of empiric Rocephin pending results but this was stopped  once it became clear that patient did not have bacterial meningitis.  At the  time of discharge patient felt much better, she was eating, she was  afebrile, ambulating and able to be discharged to home.                                               Corinna L. Lendell Caprice, MD    CLS/MEDQ  D:  07/27/2004  T:  07/28/2004  Job:  [id number]   cc:   Duwayne Heck L. Mahaffey, M.D.  [address]  [place]  [place] [address]  Fax: [phone]

## 2011-05-05 NOTE — Op Note (Signed)
NAME:  [name], [name]                        ACCOUNT NO.:  [id number]   MEDICAL RECORD NO.:  [id number]                   PATIENT TYPE:  AMB   LOCATION:  DSC                                  FACILITY:  [facility]   PHYSICIAN:  Jefry H. Pollyann Kennedy, M.D.                DATE OF BIRTH:  10-08-1981   DATE OF PROCEDURE:  10/19/2003  DATE OF DISCHARGE:                                 OPERATIVE REPORT   REFERRING PHYSICIAN:  Danielle L. Mahaffey, M.D.   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   PROCEDURE:  1. Bilateral endoscopic anterior ethmoidectomy.  2. Bilateral endoscopic maxillary antrostomy, removal of polyps and cyst     from the right side.  3. Partial middle turbinate resection, right side.   SURGEON:  Jefry H. Pollyann Kennedy, M.D.   ANESTHESIA:  General endotracheal anesthesia was used.   COMPLICATIONS:  No complications.   ESTIMATED BLOOD LOSS:  50 mL.   FINDINGS:  Severe narrowed nasal passageways bilaterally.  Enlargement of  the right middle turbinate with obstruction of the infundibulum.  Multiple  polyps/mucus cysts, right maxillary sinus, and mild diffuse chronic  inflammatory mucosal changes, bilateral ethmoid sinuses.  The patient  tolerated the procedure well, was awakened, extubated and transferred to  recovery in stable condition.   HISTORY:  This is a 30 year old with a history of recurring sinusitis.  Risks, benefits, alternatives and complications to the procedure were  explained to the patient, who seemed to understand and agreed to surgery.   PROCEDURE:  The patient was taken to the operating room and placed on the  operating table in a supine position.  Following induction of general  endotracheal anesthesia, the patient was prepped and draped in a standard  fashion.   Oxymetazoline spray was used preoperatively in the nasal cavities.  Xylocaine 1% with epinephrine was infiltrated into the superior and  posterior attachments of the middle turbinates and lateral nasal wall.  Afrin-soaked pledgets were then placed in the middle meatus bilaterally.   #1 - BILATERAL ENDOSCOPIC ANTERIOR ETHMOIDECTOMY:  Using a combination of a  0-degree and 30-degree nasal endoscopes, an anterior ethmoidectomy was  performed.  The uncinate process was taken down with a sickle knife  initially.  Suction was used to break into and to break down divisions  between the ethmoid bullae and the remaining anterior cells.  The micro-  debrider was used to perform the ethmoidectomy, removing all bony partitions  and hyperplastic mucosal tissue.  A lateral limit of dissection was the  lamina papyracea, which was kept intact.  Superiorly, the fovea was kept  intact as well.  The ground lamella was kept intact, as well as the  posterior cells were not entered.   #2 - BILATERAL MAXILLARY ANTROSTOMY WITH  REMOVAL OF TISSUE FROM THE RIGHT  SIDE:  Using 30-degree nasal endoscopes, the natural ostium was identified  bilaterally and enlarged using a backbiting forcep and the through-cut  forceps.  On the left side, the sinus was clear.  On the right side, the  sinus was filled with multiple cystic/polypoid mucosal masses which were  removed using curette and suction.  The right middle turbinate was taken  down using straight through-cut forceps, removing the anterior and inferior  aspects.  The superior attachment was kept intact.  The sinus cavities were  irrigated with saline, suctioned of all secretions and blood and irrigant  and packed with Kennedy packs bilaterally and saturated with the local  anesthetic solution.  The pharynx was suctioned of blood and secretions.  The patient was then awakened, extubated and transferred to recovery.                                                Jefry H. Pollyann Kennedy, M.D.    JHR/MEDQ  D:  10/19/2003  T:  10/19/2003  Job:  [id number]   cc:   Duwayne Heck L. Mahaffey, M.D.  [address]  [place]  [place] [address]  Fax: [phone]

## 2011-05-05 NOTE — H&P (Signed)
[name], [name]                        ACCOUNT NO.:  [id number]   MEDICAL RECORD NO.:  [id number]                   PATIENT TYPE:  INP   LOCATION:  0101                                 FACILITY:  [facility]   PHYSICIAN:  Melissa L. Ladona Ridgel, MD               DATE OF BIRTH:  1981-01-25   DATE OF ADMISSION:  07/15/2004  DATE OF DISCHARGE:                                HISTORY & PHYSICAL   PRIMARY CARE PHYSICIAN:  Danielle L. Mahaffey, M.D., [facility].  The patient also sees Dr. Cardell Peach, Dr. Zachery Conch, Dr. Randa Evens and Dr.  Derrell Lolling.   CHIEF COMPLAINT:  Headache.   HISTORY OF PRESENT ILLNESS:  The patient is a 30 year old white female with  a positive ANA, a history of Raynaud's, and chronic urticarial syndrome who  presents with a low-grade fever since Tuesday which progressed on to a  severe headache, neck pain, fevers and chills with back pain.  She also  complained of myalgias and some nausea.  The patient was seen in the  outpatient setting by her primary care physician initially and was treated  symptomatically.  She stayed home from work Thursday when she developed a  sore throat.  She was seen again by her primary care physician where strep  testing was negative but she was empirically started on Biaxin.  She states  that the headache worsened and that she awoke the same, still feeling worse  and had symptoms of presyncope.  She had to be in court this morning and  felt as if she was going to faint.   PAST MEDICAL HISTORY:  1. As stated above, positive ANA with Raynaud's phenomena thought to be     primary migraine disease.  2. Chronic urticaria.  3. Positive ANA.  4. Reflux disease severe enough to be undergoing surgery in the next couple     of weeks.  5. Irritability for which she takes Wellbutrin.   PAST SURGICAL HISTORY:  Sinus surgery.  She is scheduled for fundoplication  in a couple of weeks.   ALLERGIES:  LATEX, SULFA, NSAIDs (ALTHOUGH SHE TAKES  TYLENOL).   SOCIAL HISTORY:  She has 2 children.  She currently has an IUD in place.  She does not smoke, takes alcohol occasionally and denies illicit drug use.   MEDICATIONS:  1. Multivitamin occasionally.  2. Wellbutrin XL 150 mg daily.  3. Protonix 40 mg b.i.d.  4. Reglan 10 mg a.c. and h.s.  5. Ranitidine 150 mg daily.  6. Clarinex daily 5 mg.  7. Rhinocort p.r.n.  8. Zyrtec 5 mg daily p.r.n. for hives.  9. Zomig 5 mg 1 with the ability to repeat p.r.n. for headaches.  10.      She takes an unknown dose of Skelaxin and Thorazine p.r.n. for her     migraines.   REVIEW OF SYSTEMS:  Positive fever, chills, headache, neck pain.  No melena,  no hematochezia, no itching, no dysuria, no diarrhea.   PHYSICAL EXAMINATION:  VITAL SIGNS:  Temperature is 99.5, blood pressure is  106/72, pulse is 94, respiratory rate is 20, saturating 99% on room air.  GENERAL:  This is an ill-appearing white female in minimal distress with  mild photophobia.  She is well-developed, well-nourished.  HEENT:  Normocephalic, atraumatic.  Pupils are equal, round and reactive to  light.  Extraocular muscles are intact. Tympanic membranes are intact.  NECK:  Supple, no jugular venous distension, no thyromegaly and no bruits.  CHEST:  Clear to auscultation, no rhonchi, rales or wheezes.  CARDIOVASCULAR:  Regular rate and rhythm, she does have a 2 out of 6  systolic ejection murmur which is new for her with no rubs or gallops.  ABDOMEN:  Soft, nontender, nondistended with positive bowel sounds.  EXTREMITIES:  No clubbing, cyanosis or edema. She has 2+ pulses.  Power is  5/5.  Deep tendon reflexes are 2+. Plantares are down going.   LABORATORY DATA:  Sodium 137, potassium of 4.0, chloride of 103, CO2 of 27,  BUN of 9, creatinine is 0.9, glucose is 95.  White count is 5.0 thousand,  hemoglobin of 13.9, hematocrit of 42.3, platelets of 201,000.  Liver  function tests are within normal limits.  She underwent a  lumbar puncture in  the emergency room, tube 1 showed colorless fluid with 6 rbc's, 11 wbc's,  46% of which are neutrophils, 51% which are lymphocytes, and 3% are mono  macrocytes, a CSF glucose is 56, total protein is 27.  Tube 4 showed rbc's  of 2 and a white count of 9. Thousand.   ASSESSMENT AND PLAN:  This is a white female with likely viral meningitis.  She will be admitted for hydration and pain control and further observation,  especially in light of her history of presyncope which likely is related to  dehydration.  Her vital signs are currently stable and I do not feel that  she needs to have a telemetry bed so she will be admitted to a general  medical bed.  1. Infectious disease.  She received 1 gram of Rocephin in the emergency     room at this time.  With the viral nature of her illness we will not     continue antibiotics but will continue with symptomatic control of her     symptoms of headache and vomiting.  Will also check an EBV panel which if     she does have [name] virus would also be a contraindication for     antibiotics.   1. Pain control with Dilaudid.  Nausea control with Phenergan.   1. She has chronic urticaria, will treat her with Zyrtec and avoid latex to     which she is very sensitive.   1. GI.  She has severe reflux, awaiting fundoplication, will continue     Protonix and ranitidine.  I will also obtain a urinalysis and CNS in case     there is a coexisting urinary tract infection.                                               Melissa L. Ladona Ridgel, MD    MLT/MEDQ  D:  07/15/2004  T:  07/15/2004  Job:  [id number]

## 2011-09-11 LAB — POCT PREGNANCY, URINE
Operator id: 22937
Preg Test, Ur: NEGATIVE

## 2011-09-11 LAB — URINALYSIS, ROUTINE W REFLEX MICROSCOPIC
Hgb urine dipstick: NEGATIVE
Ketones, ur: NEGATIVE
Nitrite: NEGATIVE
Urobilinogen, UA: 0.2

## 2011-09-11 LAB — CBC
HCT: 38.5
Hemoglobin: 13
MCHC: 33.8
MCV: 91.6
RBC: 4.21
RDW: 13.3
WBC: 8.2

## 2011-09-11 LAB — LIPASE, BLOOD: Lipase: 27

## 2011-09-11 LAB — DIFFERENTIAL
Basophils Absolute: 0
Basophils Relative: 0
Eosinophils Relative: 1
Monocytes Relative: 6
Neutrophils Relative %: 72

## 2011-09-11 LAB — COMPREHENSIVE METABOLIC PANEL
ALT: 18
Alkaline Phosphatase: 55
Calcium: 9.1
GFR calc Af Amer: >60
Total Protein: 6.7

## 2011-09-22 LAB — BASIC METABOLIC PANEL WITH GFR
BUN: 9 mg/dL (ref 6–23)
CO2: 29 meq/L (ref 19–32)
Calcium: 9.6 mg/dL (ref 8.4–10.5)
Chloride: 103 meq/L (ref 96–112)
Creatinine, Ser: 0.64 mg/dL (ref 0.4–1.2)
GFR calc non Af Amer: >60 mL/min
Glucose, Bld: 121 mg/dL — ABNORMAL HIGH (ref 70–99)
Potassium: 3.8 meq/L (ref 3.5–5.1)
Sodium: 137 meq/L (ref 135–145)

## 2011-09-22 LAB — PREGNANCY, URINE: Preg Test, Ur: NEGATIVE

## 2013-10-13 ENCOUNTER — Ambulatory Visit (INDEPENDENT_AMBULATORY_CARE_PROVIDER_SITE_OTHER): Payer: PRIVATE HEALTH INSURANCE | Admitting: Gynecology

## 2013-10-13 ENCOUNTER — Other Ambulatory Visit (HOSPITAL_COMMUNITY)
Admission: RE | Admit: 2013-10-13 | Discharge: 2013-10-13 | Disposition: A | Discharge status: Home / Self Care | Payer: PRIVATE HEALTH INSURANCE | Source: Ambulatory Visit | Attending: Gynecology | Admitting: Gynecology

## 2013-10-13 ENCOUNTER — Encounter: Payer: Self-pay | Admitting: Gynecology

## 2013-10-13 VITALS — BP 126/78 | Ht 64.5 in | Wt 203.0 lb

## 2013-10-13 DIAGNOSIS — Z1151 Encounter for screening for human papillomavirus (HPV): Secondary | ICD-10-CM | POA: Insufficient documentation

## 2013-10-13 DIAGNOSIS — Z8639 Personal history of other endocrine, nutritional and metabolic disease: Secondary | ICD-10-CM

## 2013-10-13 DIAGNOSIS — Z01419 Encounter for gynecological examination (general) (routine) without abnormal findings: Secondary | ICD-10-CM | POA: Insufficient documentation

## 2013-10-13 DIAGNOSIS — N898 Other specified noninflammatory disorders of vagina: Secondary | ICD-10-CM

## 2013-10-13 LAB — WET PREP FOR TRICH, YEAST, CLUE
Clue Cells Wet Prep HPF POC: NONE SEEN
Yeast Wet Prep HPF POC: NONE SEEN

## 2013-10-13 NOTE — Progress Notes (Signed)
[name] [birth year] [id number]   History:    32 y.o.  for annual gyn exam with no complaints. Patient has not had a gynecological exam in over 4 years. Patient was seen by her primary who recently diagnosed her with vitamin D deficiency for which she is currently taking 50,000 units q. Weekly for 12 weeks. She had a ParaGard IUD removed in 2010. Patient states she is infrequently sexually active with her husband. She's not using any form of contraception. She's currently taking phentermine to help reduce weight which was started by her primary. She reports normal menstrual cycles. Many years ago she had history of low-grade squamous intraepithelial lesion a subsequent passer were normal.  Past medical history,surgical history, family history and social history were all reviewed and documented in the EPIC chart.  Gynecologic History Patient's last menstrual period was 10/09/2013. Contraception: none Last Pap: over 5 years ago. Results were: normal Last mammogram: none indicated. Results were: none indicated  Obstetric History OB History  Gravida Para Term Preterm AB SAB TAB Ectopic Multiple Living  2 2        2     # Outcome Date GA Lbr Len/2nd Weight Sex Delivery Anes PTL Lv  2 PAR           1 PAR                ROS: A ROS was performed and pertinent positives and negatives are included in the history.  GENERAL: No fevers or chills. HEENT: No change in vision, no earache, sore throat or sinus congestion. NECK: No pain or stiffness. CARDIOVASCULAR: No chest pain or pressure. No palpitations. PULMONARY: No shortness of breath, cough or wheeze. GASTROINTESTINAL: No abdominal pain, nausea, vomiting or diarrhea, melena or bright red blood per rectum. GENITOURINARY: No urinary frequency, urgency, hesitancy or dysuria. MUSCULOSKELETAL: No joint or muscle pain, no back pain, no recent trauma. DERMATOLOGIC: No rash, no itching, no lesions. ENDOCRINE: No polyuria, polydipsia, no heat or cold  intolerance. No recent change in weight. HEMATOLOGICAL: No anemia or easy bruising or bleeding. NEUROLOGIC: No headache, seizures, numbness, tingling or weakness. PSYCHIATRIC: No depression, no loss of interest in normal activity or change in sleep pattern.     Exam: chaperone present  BP 126/78  Ht 5' 4.5" (1.638 m)  Wt 203 lb (92.08 kg)  BMI 34.32 kg/m2  LMP 10/09/2013  Body mass index is 34.32 kg/(m^2).  General appearance : Well developed well nourished female. No acute distress HEENT: Neck supple, trachea midline, no carotid bruits, no thyroidmegaly Lungs: Clear to auscultation, no rhonchi or wheezes, or rib retractions  Heart: Regular rate and rhythm, no murmurs or gallops Breast:Examined in sitting and supine position were symmetrical in appearance, no palpable masses or tenderness,  no skin retraction, no nipple inversion, no nipple discharge, no skin discoloration, no axillary or supraclavicular lymphadenopathy Abdomen: no palpable masses or tenderness, no rebound or guarding Extremities: no edema or skin discoloration or tenderness  Pelvic:  Bartholin, Urethra, Skene Glands: Within normal limits             Vagina: No gross lesions or discharge,clear discharge  Cervix: No gross lesions or discharge  Uterus  anteverted, normal size, shape and consistency, non-tender and mobile  Adnexa  Without masses or tenderness  Anus and perineum  normal   Rectovaginal  normal sphincter tone without palpated masses or tenderness             Hemoccult none indicated  Wet prep negative  Assessment/Plan:  32 y.o. female for annual exam with prior history of LGSIL has not been seen in the office in approximately 5 years. Pap smear was done today with HPV screen. Patient was reminded of the importance of monthly breast exam. Patient scheduled to have her flu vaccine at work next week. Blood work drawn by patient's primary. She was started on prenatal vitamins. She was warned that she needs  to come off the phentermine if she is not using any form of contraception because of potential for teratogenicity.   Note: This dictation was prepared with  Dragon/digital dictation along withSmart phrase technology. Any transcriptional errors that result from this process are unintentional.   Ok Edwards MD, 3:57 PM 10/13/2013

## 2013-10-13 NOTE — Patient Instructions (Addendum)
Breast Self-Awareness Practicing breast self-awareness may pick up problems early, prevent significant medical complications, and possibly save your life. By practicing breast self-awareness, you can become familiar with how your breasts look and feel and if your breasts are changing. This allows you to notice changes early. It can also offer you some reassurance that your breast health is good. One way to learn what is normal for your breasts and whether your breasts are changing is to do a breast self-exam. If you find a lump or something that was not present in the past, it is best to contact your caregiver right away. Other findings that should be evaluated by your caregiver include nipple discharge, especially if it is bloody; skin changes or reddening; areas where the skin seems to be pulled in (retracted); or new lumps and bumps. Breast pain is seldom associated with cancer (malignancy), but should also be evaluated by a caregiver. HOW TO PERFORM A BREAST SELF-EXAM The best time to examine your breasts is 5 7 days after your menstrual period is over. During menstruation, the breasts are lumpier, and it may be more difficult to pick up changes. If you do not menstruate, have reached menopause, or had your uterus removed (hysterectomy), you should examine your breasts at regular intervals, such as monthly. If you are breastfeeding, examine your breasts after a feeding or after using a breast pump. Breast implants do not decrease the risk for lumps or tumors, so continue to perform breast self-exams as recommended. Talk to your caregiver about how to determine the difference between the implant and breast tissue. Also, talk about the amount of pressure you should use during the exam. Over time, you will become more familiar with the variations of your breasts and more comfortable with the exam. A breast self-exam requires you to remove all your clothes above the waist. 1. Look at your breasts and nipples.  Stand in front of a mirror in a room with good lighting. With your hands on your hips, push your hands firmly downward. Look for a difference in shape, contour, and size from one breast to the other (asymmetry). Asymmetry includes puckers, dips, or bumps. Also, look for skin changes, such as reddened or scaly areas on the breasts. Look for nipple changes, such as discharge, dimpling, repositioning, or redness. 2. Carefully feel your breasts. This is best done either in the shower or tub while using soapy water or when flat on your back. Place the arm (on the side of the breast you are examining) above your head. Use the pads (not the fingertips) of your three middle fingers on your opposite hand to feel your breasts. Start in the underarm area and use  inch (2 cm) overlapping circles to feel your breast. Use 3 different levels of pressure (light, medium, and firm pressure) at each circle before moving to the next circle. The light pressure is needed to feel the tissue closest to the skin. The medium pressure will help to feel breast tissue a little deeper, while the firm pressure is needed to feel the tissue close to the ribs. Continue the overlapping circles, moving downward over the breast until you feel your ribs below your breast. Then, move one finger-width towards the center of the body. Continue to use the  inch (2 cm) overlapping circles to feel your breast as you move slowly up toward the collar bone (clavicle) near the base of the neck. Continue the up and down exam using all 3 pressures until you reach   the middle of the chest. Do this with each breast, carefully feeling for lumps or changes. 3.  Keep a written record with breast changes or normal findings for each breast. By writing this information down, you do not need to depend only on memory for size, tenderness, or location. Write down where you are in your menstrual cycle, if you are still menstruating. Breast tissue can have some lumps or  thick tissue. However, see your caregiver if you find anything that concerns you.  SEEK MEDICAL CARE IF:  You see a change in shape, contour, or size of your breasts or nipples.   You see skin changes, such as reddened or scaly areas on the breasts or nipples.   You have an unusual discharge from your nipples.   You feel a new lump or unusually thick areas.  Document Released: 12/04/2005 Document Revised: 11/20/2012 Document Reviewed: 03/20/2012 [facility] Patient Information 2014 [place], [place]. Pregnancy If you are planning on getting pregnant, it is a good idea to make a preconception appointment with your caregiver to discuss having a healthy lifestyle before getting pregnant. This includes diet, weight, exercise, taking prenatal vitamins (especially folic acid, which helps prevent brain and spinal cord defects), avoiding alcohol, smoking and illegal drugs, medical problems (diabetes, convulsions), family history of genetic problems, working conditions, and immunizations. It is better to have knowledge of these things and do something about them before getting pregnant. During your pregnancy, it is important to follow certain guidelines in order to have a healthy baby. It is very important to get good prenatal care and follow your caregiver's instructions. Prenatal care includes all the medical care you receive before your baby's birth. This helps to prevent problems during the pregnancy and childbirth. HOME CARE INSTRUCTIONS   Start your prenatal visits by the 12th week of pregnancy or earlier, if possible. At first, appointments are usually scheduled monthly. They become more frequent in the last 2 months before delivery. It is important that you keep your caregiver's appointments and follow your caregiver's instructions regarding medication use, exercise, and diet.  During pregnancy, you are providing food for you and your baby. Eat a regular, well-balanced diet. Choose foods such as  meat, fish, milk and other dairy products, vegetables, fruits, whole-grain breads and cereals. Your caregiver will inform you of the ideal weight gain depending on your current height and weight. Drink lots of liquids. Try to drink 8 glasses of water a day.  Alcohol is associated with a number of birth defects including fetal alcohol syndrome. It is best to avoid alcohol completely. Smoking will cause low birth rate and prematurity. Use of alcohol and nicotine during your pregnancy also increases the chances that your child will be chemically dependent later in their life and may contribute to SIDS (Sudden Infant Death Syndrome).  Do not use illegal drugs.  Only take prescription or over-the-counter medications that are recommended by your caregiver. Other medications can cause genetic and physical problems in the baby.  Morning sickness can often be helped by keeping soda crackers at the bedside. Eat a few before getting up in the morning.  A sexual relationship may be continued until near the end of pregnancy if there are no other problems such as early (premature) leaking of amniotic fluid from the membranes, vaginal bleeding, painful intercourse or belly (abdominal) pain.  Exercise regularly. Check with your caregiver if you are unsure of the safety of some of your exercises.  Do not use hot tubs, steam rooms or  saunas. These increase the risk of fainting and hurting yourself and the baby. Swimming is OK for exercise. Get plenty of rest, including afternoon naps when possible, especially in the third trimester.  Avoid toxic odors and chemicals.  Do not wear high heels. They may cause you to lose your balance and fall.  Do not lift over 5 pounds. If you do lift anything, lift with your legs and thighs, not your back.  Avoid long trips, especially in the third trimester.  If you have to travel out of the city or state, take a copy of your medical records with you. SEEK IMMEDIATE MEDICAL  CARE IF:   You develop an unexplained oral temperature above 102 F (38.9 C), or as your caregiver suggests.  You have leaking of fluid from the vagina. If leaking membranes are suspected, take your temperature and inform your caregiver of this when you call.  There is vaginal spotting or bleeding. Notify your caregiver of the amount and how many pads are used.  You continue to feel sick to your stomach (nauseous) and have no relief from remedies suggested, or you throw up (vomit) blood or coffee ground like materials.  You develop upper abdominal pain.  You have round ligament discomfort in the lower abdominal area. This still must be evaluated by your caregiver.  You feel contractions of the uterus.  You do not feel the baby move, or there is less movement than before.  You have painful urination.  You have abnormal vaginal discharge.  You have persistent diarrhea.  You get a severe headache.  You have problems with your vision.  You develop muscle weakness.  You feel dizzy and faint.  You develop shortness of breath.  You develop chest pain.  You have back pain that travels down to your leg and feet.  You feel irregular or a very fast heartbeat.  You develop excessive weight gain in a short period of time (5 pounds in 3 to 5 days).  You are involved in a domestic violence situation. Document Released: 12/04/2005 Document Revised: 06/04/2012 Document Reviewed: 05/28/2009 [facility] Patient Information 2014 [place], [place].

## 2013-10-14 LAB — URINALYSIS W MICROSCOPIC + REFLEX CULTURE
Ketones, ur: NEGATIVE mg/dL
Nitrite: NEGATIVE
Urobilinogen, UA: 0.2 mg/dL (ref 0.0–1.0)

## 2013-10-29 ENCOUNTER — Telehealth: Payer: Self-pay | Admitting: *Deleted

## 2013-10-29 NOTE — Telephone Encounter (Signed)
Pt informed with lab results on 10/13/13 OV

## 2013-11-11 ENCOUNTER — Telehealth: Payer: Self-pay | Admitting: *Deleted

## 2013-11-11 MED ORDER — PRENATE MINI 29-0.6-0.4-350 MG PO CAPS: 350.0000 mg | ORAL_CAPSULE | Freq: Every day | ORAL | Status: DC

## 2013-11-11 NOTE — Telephone Encounter (Signed)
I would recommend Prenate Mini which has DHA but are smaller in size.

## 2013-11-11 NOTE — Telephone Encounter (Signed)
Pt was seen on 10/13/13 given samples of prenatal vitamins, pt lost samples. She asked if Rx could be called in or if you recommend a OTC? If OTC any brand okay? Does it need to have DHA?  Please advise

## 2013-11-11 NOTE — Telephone Encounter (Signed)
rx called in

## 2014-10-19 ENCOUNTER — Encounter: Payer: Self-pay | Admitting: Gynecology

## 2014-12-18 NOTE — L&D Delivery Note (Signed)
Delivery Note At 4:44 PM a viable and healthy female was delivered via Vaginal, Spontaneous Delivery (Presentation: Left Occiput Anterior).  APGAR: 8, 9; weight  .   Placenta status: Intact, Spontaneous.  Cord: 3 vessels with a loose nuchal x 1 reduced on the perineum  Anesthesia: Epidural  Episiotomy: None Lacerations:  1st degree perineal Suture Repair: 3.0 vicryl Est. Blood Loss (mL): 50  Mom to postpartum.  Baby to Couplet care / Skin to Skin.  Lindsey Floren H. 12/11/2015, 5:20 PM

## 2015-04-30 ENCOUNTER — Encounter: Payer: Self-pay | Admitting: Gynecology

## 2015-04-30 ENCOUNTER — Ambulatory Visit (INDEPENDENT_AMBULATORY_CARE_PROVIDER_SITE_OTHER): Payer: BLUE CROSS/BLUE SHIELD | Admitting: Gynecology

## 2015-04-30 ENCOUNTER — Other Ambulatory Visit: Payer: Self-pay | Admitting: Gynecology

## 2015-04-30 VITALS — BP 128/78

## 2015-04-30 DIAGNOSIS — Z8639 Personal history of other endocrine, nutritional and metabolic disease: Secondary | ICD-10-CM | POA: Diagnosis not present

## 2015-04-30 DIAGNOSIS — N912 Amenorrhea, unspecified: Secondary | ICD-10-CM

## 2015-04-30 DIAGNOSIS — Z3490 Encounter for supervision of normal pregnancy, unspecified, unspecified trimester: Secondary | ICD-10-CM | POA: Insufficient documentation

## 2015-04-30 DIAGNOSIS — Z331 Pregnant state, incidental: Secondary | ICD-10-CM

## 2015-04-30 LAB — PREGNANCY, URINE: Preg Test, Ur: POSITIVE

## 2015-04-30 MED ORDER — METOCLOPRAMIDE HCL 10 MG PO TABS: 10.0000 mg | ORAL_TABLET | Freq: Three times a day (TID) | ORAL | Status: DC

## 2015-04-30 NOTE — Progress Notes (Signed)
   Patient is a 34 year old now gravida 3 para 2 AB 0 (2 normal spontaneous vaginal deliveries) who presented to the office today stating she had not had a menstrual cycle since March 22 of this year. She had a home pregnancy test which was positive and was retested here in the office and it was positive as well. She's had some nausea but no true vomiting. And some breast tenderness. In 2005 patient had a fundoplication as a result of her obesity. She also 2 weeks ago completed treatment for vitamin D deficiency.  Exam: Blood pressure 122/78 Gen. appearance well-developed well-nourished female in no acute distress Pelvic exam: Abdomen: Soft nontender no rebound or guarding Pelvic: Bartholin urethra Skene was within normal limits Vagina: No lesions or discharge Cervix no lesions or discharge Uterus slightly retroverted 6-8 weeks size nontender and mobile Adnexa: No palpable mass or tenderness Rectal exam not done  Assessment/plan: First trimester pregnancy. Based on last menstrual period she is currently 7/[redacted] weeks gestation with a due date of December 27 of 2016. A quantitative beta-hCG will be drawn today she will return back to the office next week for an ultrasound to confirm viability and dating. Because of her history vitamin D deficiency which she recently completed treatment with 1 to check her vitamin D level today. She was reminded to stay on her prenatal vitamins. For her nausea she'll be prescribed Reglan 10 mg 1 by mouth every 6 hours when necessary

## 2015-04-30 NOTE — Patient Instructions (Signed)
First Trimester of Pregnancy The first trimester of pregnancy is from week 1 until the end of week 12 (months 1 through 3). During this time, your baby will begin to develop inside you. At 6-8 weeks, the eyes and face are formed, and the heartbeat can be seen on ultrasound. At the end of 12 weeks, all the baby's organs are formed. Prenatal care is all the medical care you receive before the birth of your baby. Make sure you get good prenatal care and follow all of your doctor's instructions. HOME CARE  Medicines  Take medicine only as told by your doctor. Some medicines are safe and some are not during pregnancy.  Take your prenatal vitamins as told by your doctor.  Take medicine that helps you poop (stool softener) as needed if your doctor says it is okay. Diet  Eat regular, healthy meals.  Your doctor will tell you the amount of weight gain that is right for you.  Avoid raw meat and uncooked cheese.  If you feel sick to your stomach (nauseous) or throw up (vomit):  Eat 4 or 5 small meals a day instead of 3 large meals.  Try eating a few soda crackers.  Drink liquids between meals instead of during meals.  If you have a hard time pooping (constipation):  Eat high-fiber foods like fresh vegetables, fruit, and whole grains.  Drink enough fluids to keep your pee (urine) clear or pale yellow. Activity and Exercise  Exercise only as told by your doctor. Stop exercising if you have cramps or pain in your lower belly (abdomen) or low back.  Try to avoid standing for long periods of time. Move your legs often if you must stand in one place for a long time.  Avoid heavy lifting.  Wear low-heeled shoes. Sit and stand up straight.  You can have sex unless your doctor tells you not to. Relief of Pain or Discomfort  Wear a good support bra if your breasts are sore.  Take warm water baths (sitz baths) to soothe pain or discomfort caused by hemorrhoids. Use hemorrhoid cream if your  doctor says it is okay.  Rest with your legs raised if you have leg cramps or low back pain.  Wear support hose if you have puffy, bulging veins (varicose veins) in your legs. Raise (elevate) your feet for 15 minutes, 3-4 times a day. Limit salt in your diet. Prenatal Care  Schedule your prenatal visits by the twelfth week of pregnancy.  Write down your questions. Take them to your prenatal visits.  Keep all your prenatal visits as told by your doctor. Safety  Wear your seat belt at all times when driving.  Make a list of emergency phone numbers. The list should include numbers for family, friends, the hospital, and police and fire departments. General Tips  Ask your doctor for a referral to a local prenatal class. Begin classes no later than at the start of month 6 of your pregnancy.  Ask for help if you need counseling or help with nutrition. Your doctor can give you advice or tell you where to go for help.  Do not use hot tubs, steam rooms, or saunas.  Do not douche or use tampons or scented sanitary pads.  Do not cross your legs for long periods of time.  Avoid litter boxes and soil used by cats.  Avoid all smoking, herbs, and alcohol. Avoid drugs not approved by your doctor.  Visit your dentist. At home, brush your teeth   with a soft toothbrush. Be gentle when you floss. GET HELP IF:  You are dizzy.  You have mild cramps or pressure in your lower belly.  You have a nagging pain in your belly area.  You continue to feel sick to your stomach, throw up, or have watery poop (diarrhea).  You have a bad smelling fluid coming from your vagina.  You have pain with peeing (urination).  You have increased puffiness (swelling) in your face, hands, legs, or ankles. GET HELP RIGHT AWAY IF:   You have a fever.  You are leaking fluid from your vagina.  You have spotting or bleeding from your vagina.  You have very bad belly cramping or pain.  You gain or lose weight  rapidly.  You throw up blood. It may look like coffee grounds.  You are around people who have German measles, fifth disease, or chickenpox.  You have a very bad headache.  You have shortness of breath.  You have any kind of trauma, such as from a fall or a car accident. Document Released: 05/22/2008 Document Revised: 04/20/2014 Document Reviewed: 10/14/2013 [employer] Patient Information 2015 [employer]. This information is not intended to replace advice given to you by your health care provider. Make sure you discuss any questions you have with your health care provider.  

## 2015-05-01 LAB — VITAMIN D 25 HYDROXY (VIT D DEFICIENCY, FRACTURES): Vit D, 25-Hydroxy: 16 ng/mL — ABNORMAL LOW (ref 30–100)

## 2015-05-02 ENCOUNTER — Encounter: Payer: Self-pay | Admitting: Gynecology

## 2015-05-03 ENCOUNTER — Telehealth: Payer: Self-pay

## 2015-05-03 NOTE — Telephone Encounter (Signed)
Tell her that I spoke with him this afternoon and he's going to research further and let me know and I will get back with her. For now tell her to take 2 tums tablets daily

## 2015-05-03 NOTE — Telephone Encounter (Signed)
Patient informed. She asked about her quant result as Dr. Glenetta HewJF was wanting to time u/s with it.  It had not been run but Joni Reiningicole will add it and they can run it with blood from Vit D.  Patient informed I will have result tomorrow afternoon.

## 2015-05-03 NOTE — Telephone Encounter (Signed)
Patient called to find out about her lab results. This is the patient you were waiting to talk with maternal fetal medicine MD.  Please advise what to tell her? thanks

## 2015-05-04 LAB — HCG, QUANTITATIVE, PREGNANCY: HCG, BETA CHAIN, QUANT, S: 33443.1 m[IU]/mL

## 2015-05-04 NOTE — Telephone Encounter (Signed)
She does not need another quantitative beta hCG. Follow-up with ultrasound has previously scheduled

## 2015-05-04 NOTE — Telephone Encounter (Signed)
Patient informed. 

## 2015-05-04 NOTE — Telephone Encounter (Signed)
Patient's quant returned at [phone].  Patient said you had told her you wanted to check the level to be sure appropriate timing for her ultrasound scheduled on 05/11/15. Ok?

## 2015-05-11 ENCOUNTER — Encounter: Payer: Self-pay | Admitting: Women's Health

## 2015-05-11 ENCOUNTER — Ambulatory Visit (INDEPENDENT_AMBULATORY_CARE_PROVIDER_SITE_OTHER): Payer: BLUE CROSS/BLUE SHIELD

## 2015-05-11 ENCOUNTER — Ambulatory Visit (INDEPENDENT_AMBULATORY_CARE_PROVIDER_SITE_OTHER): Payer: BLUE CROSS/BLUE SHIELD | Admitting: Women's Health

## 2015-05-11 ENCOUNTER — Other Ambulatory Visit: Payer: Self-pay | Admitting: Women's Health

## 2015-05-11 VITALS — BP 130/82

## 2015-05-11 DIAGNOSIS — O209 Hemorrhage in early pregnancy, unspecified: Secondary | ICD-10-CM

## 2015-05-11 DIAGNOSIS — Z3490 Encounter for supervision of normal pregnancy, unspecified, unspecified trimester: Secondary | ICD-10-CM

## 2015-05-11 DIAGNOSIS — Z36 Encounter for antenatal screening of mother: Secondary | ICD-10-CM

## 2015-05-11 DIAGNOSIS — N912 Amenorrhea, unspecified: Secondary | ICD-10-CM

## 2015-05-11 DIAGNOSIS — O3680X0 Pregnancy with inconclusive fetal viability, not applicable or unspecified: Secondary | ICD-10-CM | POA: Diagnosis not present

## 2015-05-11 DIAGNOSIS — Z331 Pregnant state, incidental: Secondary | ICD-10-CM | POA: Diagnosis not present

## 2015-05-11 DIAGNOSIS — Z3689 Encounter for other specified antenatal screening: Secondary | ICD-10-CM

## 2015-05-11 NOTE — Patient Instructions (Signed)
First Trimester of Pregnancy The first trimester of pregnancy is from week 1 until the end of week 12 (months 1 through 3). A week after a sperm fertilizes an egg, the egg will implant on the wall of the uterus. This embryo will begin to develop into a baby. Genes from you and your partner are forming the baby. The female genes determine whether the baby is a boy or a girl. At 6-8 weeks, the eyes and face are formed, and the heartbeat can be seen on ultrasound. At the end of 12 weeks, all the baby's organs are formed.  Now that you are pregnant, you will want to do everything you can to have a healthy baby. Two of the most important things are to get good prenatal care and to follow your health care provider's instructions. Prenatal care is all the medical care you receive before the baby's birth. This care will help prevent, find, and treat any problems during the pregnancy and childbirth. BODY CHANGES Your body goes through many changes during pregnancy. The changes vary from woman to woman.   You may gain or lose a couple of pounds at first.  You may feel sick to your stomach (nauseous) and throw up (vomit). If the vomiting is uncontrollable, call your health care provider.  You may tire easily.  You may develop headaches that can be relieved by medicines approved by your health care provider.  You may urinate more often. Painful urination may mean you have a bladder infection.  You may develop heartburn as a result of your pregnancy.  You may develop constipation because certain hormones are causing the muscles that push waste through your intestines to slow down.  You may develop hemorrhoids or swollen, bulging veins (varicose veins).  Your breasts may begin to grow larger and become tender. Your nipples may stick out more, and the tissue that surrounds them (areola) may become darker.  Your gums may bleed and may be sensitive to brushing and flossing.  Dark spots or blotches (chloasma,  mask of pregnancy) may develop on your face. This will likely fade after the baby is born.  Your menstrual periods will stop.  You may have a loss of appetite.  You may develop cravings for certain kinds of food.  You may have changes in your emotions from day to day, such as being excited to be pregnant or being concerned that something may go wrong with the pregnancy and baby.  You may have more vivid and strange dreams.  You may have changes in your hair. These can include thickening of your hair, rapid growth, and changes in texture. Some women also have hair loss during or after pregnancy, or hair that feels dry or thin. Your hair will most likely return to normal after your baby is born. WHAT TO EXPECT AT YOUR PRENATAL VISITS During a routine prenatal visit:  You will be weighed to make sure you and the baby are growing normally.  Your blood pressure will be taken.  Your abdomen will be measured to track your baby's growth.  The fetal heartbeat will be listened to starting around week 10 or 12 of your pregnancy.  Test results from any previous visits will be discussed. Your health care provider may ask you:  How you are feeling.  If you are feeling the baby move.  If you have had any abnormal symptoms, such as leaking fluid, bleeding, severe headaches, or abdominal cramping.  If you have any questions. Other tests   that may be performed during your first trimester include:  Blood tests to find your blood type and to check for the presence of any previous infections. They will also be used to check for low iron levels (anemia) and Rh antibodies. Later in the pregnancy, blood tests for diabetes will be done along with other tests if problems develop.  Urine tests to check for infections, diabetes, or protein in the urine.  An ultrasound to confirm the proper growth and development of the baby.  An amniocentesis to check for possible genetic problems.  Fetal screens for  spina bifida and Down syndrome.  You may need other tests to make sure you and the baby are doing well. HOME CARE INSTRUCTIONS  Medicines  Follow your health care provider's instructions regarding medicine use. Specific medicines may be either safe or unsafe to take during pregnancy.  Take your prenatal vitamins as directed.  If you develop constipation, try taking a stool softener if your health care provider approves. Diet  Eat regular, well-balanced meals. Choose a variety of foods, such as meat or vegetable-based protein, fish, milk and low-fat dairy products, vegetables, fruits, and whole grain breads and cereals. Your health care provider will help you determine the amount of weight gain that is right for you.  Avoid raw meat and uncooked cheese. These carry germs that can cause birth defects in the baby.  Eating four or five small meals rather than three large meals a day may help relieve nausea and vomiting. If you start to feel nauseous, eating a few soda crackers can be helpful. Drinking liquids between meals instead of during meals also seems to help nausea and vomiting.  If you develop constipation, eat more high-fiber foods, such as fresh vegetables or fruit and whole grains. Drink enough fluids to keep your urine clear or pale yellow. Activity and Exercise  Exercise only as directed by your health care provider. Exercising will help you:  Control your weight.  Stay in shape.  Be prepared for labor and delivery.  Experiencing pain or cramping in the lower abdomen or low back is a good sign that you should stop exercising. Check with your health care provider before continuing normal exercises.  Try to avoid standing for long periods of time. Move your legs often if you must stand in one place for a long time.  Avoid heavy lifting.  Wear low-heeled shoes, and practice good posture.  You may continue to have sex unless your health care provider directs you  otherwise. Relief of Pain or Discomfort  Wear a good support bra for breast tenderness.   Take warm sitz baths to soothe any pain or discomfort caused by hemorrhoids. Use hemorrhoid cream if your health care provider approves.   Rest with your legs elevated if you have leg cramps or low back pain.  If you develop varicose veins in your legs, wear support hose. Elevate your feet for 15 minutes, 3-4 times a day. Limit salt in your diet. Prenatal Care  Schedule your prenatal visits by the twelfth week of pregnancy. They are usually scheduled monthly at first, then more often in the last 2 months before delivery.  Write down your questions. Take them to your prenatal visits.  Keep all your prenatal visits as directed by your health care provider. Safety  Wear your seat belt at all times when driving.  Make a list of emergency phone numbers, including numbers for family, friends, the hospital, and police and fire departments. General Tips    Ask your health care provider for a referral to a local prenatal education class. Begin classes no later than at the beginning of month 6 of your pregnancy.  Ask for help if you have counseling or nutritional needs during pregnancy. Your health care provider can offer advice or refer you to specialists for help with various needs.  Do not use hot tubs, steam rooms, or saunas.  Do not douche or use tampons or scented sanitary pads.  Do not cross your legs for long periods of time.  Avoid cat litter boxes and soil used by cats. These carry germs that can cause birth defects in the baby and possibly loss of the fetus by miscarriage or stillbirth.  Avoid all smoking, herbs, alcohol, and medicines not prescribed by your health care provider. Chemicals in these affect the formation and growth of the baby.  Schedule a dentist appointment. At home, brush your teeth with a soft toothbrush and be gentle when you floss. SEEK MEDICAL CARE IF:   You have  dizziness.  You have mild pelvic cramps, pelvic pressure, or nagging pain in the abdominal area.  You have persistent nausea, vomiting, or diarrhea.  You have a bad smelling vaginal discharge.  You have pain with urination.  You notice increased swelling in your face, hands, legs, or ankles. SEEK IMMEDIATE MEDICAL CARE IF:   You have a fever.  You are leaking fluid from your vagina.  You have spotting or bleeding from your vagina.  You have severe abdominal cramping or pain.  You have rapid weight gain or loss.  You vomit blood or material that looks like coffee grounds.  You are exposed to [place] measles and have never had them.  You are exposed to fifth disease or chickenpox.  You develop a severe headache.  You have shortness of breath.  You have any kind of trauma, such as from a fall or a car accident. Document Released: 11/28/2001 Document Revised: 04/20/2014 Document Reviewed: 10/14/2013 [facility] Patient Information 2015 [place], [place]. This information is not intended to replace advice given to you by your health care provider. Make sure you discuss any questions you have with your health care provider. Back Pain in Pregnancy Back pain during pregnancy is common. It happens in about half of all pregnancies. It is important for you and your baby that you remain active during your pregnancy.If you feel that back pain is not allowing you to remain active or sleep well, it is time to see your caregiver. Back pain may be caused by several factors related to changes during your pregnancy.Fortunately, unless you had trouble with your back before your pregnancy, the pain is likely to get better after you deliver. Low back pain usually occurs between the fifth and seventh months of pregnancy. It can, however, happen in the first couple months. Factors that increase the risk of back problems include:   Previous back problems.  Injury to your back.  Having twins or  multiple births.  A chronic cough.  Stress.  Job-related repetitive motions.  Muscle or spinal disease in the back.  Family history of back problems, ruptured (herniated) discs, or osteoporosis.  Depression, anxiety, and panic attacks. CAUSES   When you are pregnant, your body produces a hormone called relaxin. This hormonemakes the ligaments connecting the low back and pubic bones more flexible. This flexibility allows the baby to be delivered more easily. When your ligaments are loose, your muscles need to work harder to support your back. Soreness in  your back can come from tired muscles. Soreness can also come from back tissues that are irritated since they are receiving less support.  As the baby grows, it puts pressure on the nerves and blood vessels in your pelvis. This can cause back pain.  As the baby grows and gets heavier during pregnancy, the uterus pushes the stomach muscles forward and changes your center of gravity. This makes your back muscles work harder to maintain good posture. SYMPTOMS  Lumbar pain during pregnancy Lumbar pain during pregnancy usually occurs at or above the waist in the center of the back. There may be pain and numbness that radiates into your leg or foot. This is similar to low back pain experienced by non-pregnant women. It usually increases with sitting for long periods of time, standing, or repetitive lifting. Tenderness may also be present in the muscles along your upper back. Posterior pelvic pain during pregnancy Pain in the back of the pelvis is more common than lumbar pain in pregnancy. It is a deep pain felt in your side at the waistline, or across the tailbone (sacrum), or in both places. You may have pain on one or both sides. This pain can also go into the buttocks and backs of the upper thighs. Pubic and groin pain may also be present. The pain does not quickly resolve with rest, and morning stiffness may also be present. Pelvic pain during  pregnancy can be brought on by most activities. A high level of fitness before and during pregnancy may or may not prevent this problem. Labor pain is usually 1 to 2 minutes apart, lasts for about 1 minute, and involves a bearing down feeling or pressure in your pelvis. However, if you are at term with the pregnancy, constant low back pain can be the beginning of early labor, and you should be aware of this. DIAGNOSIS  X-rays of the back should not be done during the first 12 to 14 weeks of the pregnancy and only when absolutely necessary during the rest of the pregnancy. MRIs do not give off radiation and are safe during pregnancy. MRIs also should only be done when absolutely necessary. HOME CARE INSTRUCTIONS  Exercise as directed by your caregiver. Exercise is the most effective way to prevent or manage back pain. If you have a back problem, it is especially important to avoid sports that require sudden body movements. Swimming and walking are great activities.  Do not stand in one place for long periods of time.  Do not wear high heels.  Sit in chairs with good posture. Use a pillow on your lower back if necessary. Make sure your head rests over your shoulders and is not hanging forward.  Try sleeping on your side, preferably the left side, with a pillow or two between your legs. If you are sore after a night's rest, your bedmay betoo soft.Try placing a board between your mattress and box spring.  Listen to your body when lifting.If you are experiencing pain, ask for help or try bending yourknees more so you can use your leg muscles rather than your back muscles. Squat down when picking up something from the floor. Do not bend over.  Eat a healthy diet. Try to gain weight within your caregiver's recommendations.  Use heat or cold packs 3 to 4 times a day for 15 minutes to help with the pain.  Only take over-the-counter or prescription medicines for pain, discomfort, or fever as directed  by your caregiver. Sudden (acute) back  pain  Use bed rest for only the most extreme, acute episodes of back pain. Prolonged bed rest over 48 hours will aggravate your condition.  Ice is very effective for acute conditions.  Put ice in a plastic bag.  Place a towel between your skin and the bag.  Leave the ice on for 10 to 20 minutes every 2 hours, or as needed.  Using heat packs for 30 minutes prior to activities is also helpful. Continued back pain See your caregiver if you have continued problems. Your caregiver can help or refer you for appropriate physical therapy. With conditioning, most back problems can be avoided. Sometimes, a more serious issue may be the cause of back pain. You should be seen right away if new problems seem to be developing. Your caregiver may recommend:  A maternity girdle.  An elastic sling.  A back brace.  A massage therapist or acupuncture. SEEK MEDICAL CARE IF:   You are not able to do most of your daily activities, even when taking the pain medicine you were given.  You need a referral to a physical therapist or chiropractor.  You want to try acupuncture. SEEK IMMEDIATE MEDICAL CARE IF:  You develop numbness, tingling, weakness, or problems with the use of your arms or legs.  You develop severe back pain that is no longer relieved with medicines.  You have a sudden change in bowel or bladder control.  You have increasing pain in other areas of the body.  You develop shortness of breath, dizziness, or fainting.  You develop nausea, vomiting, or sweating.  You have back pain which is similar to labor pains.  You have back pain along with your water breaking or vaginal bleeding.  You have back pain or numbness that travels down your leg.  Your back pain developed after you fell.  You develop pain on one side of your back. You may have a kidney stone.  You see blood in your urine. You may have a bladder infection or kidney  stone.  You have back pain with blisters. You may have shingles. Back pain is fairly common during pregnancy but should not be accepted as just part of the process. Back pain should always be treated as soon as possible. This will make your pregnancy as pleasant as possible. Document Released: 03/14/2006 Document Revised: 02/26/2012 Document Reviewed: 04/25/2011 [facility] Patient Information 2015 [place], [place]. This information is not intended to replace advice given to you by your health care provider. Make sure you discuss any questions you have with your health care provider.

## 2015-05-11 NOTE — Progress Notes (Signed)
Patient ID: Lindsey ManisHeather L Meyer, female   DOB: 10/25/1981, 34 y.o.   MRN: [id number] Presents for ultrasound for viability. LMP 03/09/2015, menstrual cycles every 5-6 weeks. Denies bleeding, abdominal pain, cramping or discharge. Taking prenatal vitamin daily. Aware to avoid cat litter.    Exam: Appears well. Ultrasound: Transvaginal retroverted uterus living IUP seen in fundus. Size less than dates by LMP by 11 days. Clinical age 519 w 0 d, ultrasound age 517 w 3 d. Fetal heart rate 150, fetal pole noted, cervix long and closed. Normal-shaped yolk sac. Right ovary corpus luteum cyst 24 x 26 mm, left ovary normal. Negative cul-de-sac. Subchorionic hematoma at cervix 12 x 4 x 11 mm.  S<D  Small subchorionic hematoma Obesity  Plan: Schedule new OB care, aware we no longer deliver. [facility]. Aware of safe pregnancy behaviors. Continue prenatal vitamin daily. Copy of ultrasound given. Instructed to call or return if bleeding, avoid intercourse.

## 2015-06-08 ENCOUNTER — Other Ambulatory Visit: Payer: Self-pay | Admitting: Obstetrics and Gynecology

## 2015-06-09 LAB — CYTOLOGY - PAP

## 2015-08-02 ENCOUNTER — Inpatient Hospital Stay (HOSPITAL_COMMUNITY): Payer: Medicaid Other

## 2015-08-02 ENCOUNTER — Inpatient Hospital Stay (HOSPITAL_COMMUNITY)
Admission: AD | Admit: 2015-08-02 | Discharge: 2015-08-02 | Disposition: A | Discharge status: Home / Self Care | Payer: Medicaid Other | Source: Ambulatory Visit | Attending: Obstetrics and Gynecology | Admitting: Obstetrics and Gynecology

## 2015-08-02 ENCOUNTER — Encounter (HOSPITAL_COMMUNITY): Payer: Self-pay | Admitting: *Deleted

## 2015-08-02 DIAGNOSIS — F329 Major depressive disorder, single episode, unspecified: Secondary | ICD-10-CM | POA: Diagnosis not present

## 2015-08-02 DIAGNOSIS — R1013 Epigastric pain: Secondary | ICD-10-CM | POA: Diagnosis not present

## 2015-08-02 DIAGNOSIS — R109 Unspecified abdominal pain: Secondary | ICD-10-CM | POA: Diagnosis not present

## 2015-08-02 DIAGNOSIS — Z3A20 20 weeks gestation of pregnancy: Secondary | ICD-10-CM | POA: Diagnosis not present

## 2015-08-02 DIAGNOSIS — K219 Gastro-esophageal reflux disease without esophagitis: Secondary | ICD-10-CM | POA: Insufficient documentation

## 2015-08-02 LAB — COMPREHENSIVE METABOLIC PANEL
ALK PHOS: 92 U/L (ref 38–126)
ALT: 55 U/L — AB (ref 14–54)
AST: 31 U/L (ref 15–41)
Albumin: 3 g/dL — ABNORMAL LOW (ref 3.5–5.0)
Anion gap: 10 (ref 5–15)
BUN: 5 mg/dL — AB (ref 6–20)
CALCIUM: 8.9 mg/dL (ref 8.9–10.3)
CO2: 21 mmol/L — AB (ref 22–32)
CREATININE: 0.62 mg/dL (ref 0.44–1.00)
Chloride: 105 mmol/L (ref 101–111)
GFR calc non Af Amer: >60 mL/min (ref >60)
Glucose, Bld: 108 mg/dL — ABNORMAL HIGH (ref 65–99)
Potassium: 3.6 mmol/L (ref 3.5–5.1)
SODIUM: 136 mmol/L (ref 135–145)
Total Bilirubin: 0.3 mg/dL (ref 0.3–1.2)
Total Protein: 6.7 g/dL (ref 6.5–8.1)

## 2015-08-02 LAB — AMYLASE: AMYLASE: 44 U/L (ref 28–100)

## 2015-08-02 LAB — CBC
HCT: 37.9 % (ref 36.0–46.0)
Hemoglobin: 13.2 g/dL (ref 12.0–15.0)
MCH: 31.5 pg (ref 26.0–34.0)
MCHC: 34.8 g/dL (ref 30.0–36.0)
MCV: 90.5 fL (ref 78.0–100.0)
PLATELETS: 224 10*3/uL (ref 150–400)
RBC: 4.19 MIL/uL (ref 3.87–5.11)
RDW: 13.1 % (ref 11.5–15.5)
WBC: 10.4 10*3/uL (ref 4.0–10.5)

## 2015-08-02 LAB — LIPASE, BLOOD: Lipase: 20 U/L — ABNORMAL LOW (ref 22–51)

## 2015-08-02 NOTE — MAU Provider Note (Signed)
History     CSN: [id number]  Arrival date and time: 08/02/15 0144   First Provider Initiated Contact with Patient 08/02/15 0209      Chief Complaint  Patient presents with  . Abdominal Pain   HPI  Ms. [name] is a 34 y.o. G3P2 at [redacted]w[redacted]d here with report of sudden onset of mid epigastric pain that started around midnight.  Pain is described as sharp and rated an 8/10.  Pt reports difficulty in obtaining a deep breath due to the pain.  Also reports nausea and vomiting which has been present throughout the pregnancy.  Abdominal pain has decreased but is still present.  Pain increases with palpation and deep inhalation.    Past Medical History  Diagnosis Date  . GERD (gastroesophageal reflux disease)   . Depression   . Migraines   . LGSIL (low grade squamous intraepithelial lesion) on Pap smear 08/2004    Past Surgical History  Procedure Laterality Date  . Nasal sinus surgery    . Nissenfundo plication  2005    Family History  Problem Relation Age of Onset  . Hypertension Mother   . Endometriosis Sister   . Cancer Maternal Grandmother   . Diabetes Maternal Grandfather     Social History  Substance Use Topics  . Smoking status: Never Smoker   . Smokeless tobacco: Never Used  . Alcohol Use: 0.0 oz/week    0 Standard drinks or equivalent per week     Comment: occ    Allergies:  Allergies  Allergen Reactions  . Latex   . Nsaids   . Sulfa Antibiotics     Prescriptions prior to admission  Medication Sig Dispense Refill Last Dose  . metoCLOPramide (REGLAN) 10 MG tablet Take 1 tablet (10 mg total) by mouth 3 (three) times daily with meals. 30 tablet 1   . phentermine 37.5 MG capsule Take 37.5 mg by mouth every morning.   Not Taking  . Prenat w/o A-FeCbn-Meth-FA-DHA (PRENATE MINI) 29-0.6-0.4-350 MG CAPS Take 350 mg by mouth daily. 30 capsule 3 Taking  . Vitamin D, Ergocalciferol, (DRISDOL) 50000 UNITS CAPS capsule Take 50,000 Units by mouth every 7 (seven)  days.   Not Taking    Review of Systems  Constitutional: Negative for fever and chills.  HENT: Negative for sore throat.   Respiratory: Positive for shortness of breath (difficulty obtaining a deep breath).   Cardiovascular: Negative for chest pain and palpitations.  Gastrointestinal: Positive for heartburn, nausea, vomiting and abdominal pain. Negative for diarrhea and constipation.  Genitourinary: Positive for frequency. Negative for dysuria and urgency.   Physical Exam   Blood pressure 115/82, pulse 93, temperature 97.9 F (36.6 C), temperature source Oral, resp. rate 18, last menstrual period 03/09/2015, SpO2 98 %.  Physical Exam  Constitutional: She is oriented to person, place, and time. She appears well-developed and well-nourished. No distress.  Appears uncomfortable  HENT:  Head: Normocephalic.  Neck: Normal range of motion. Neck supple.  Cardiovascular: Normal rate, regular rhythm and normal heart sounds.   Respiratory: Effort normal and breath sounds normal. No respiratory distress. She has no wheezes. She has no rales. She exhibits tenderness.  GI: Soft. There is tenderness.  Genitourinary: No bleeding in the vagina.  Musculoskeletal: Normal range of motion. She exhibits no edema.  Neurological: She is alert and oriented to person, place, and time.  Skin: Skin is warm and dry.   Fetal heart rate:  147 MAU Course  Procedures  Results for orders  placed or performed during the hospital encounter of 08/02/15 (from the past 24 hour(s))  Comprehensive metabolic panel     Status: Abnormal   Collection Time: 08/02/15  2:23 AM  Result Value Ref Range   Sodium 136 135 - 145 mmol/L   Potassium 3.6 3.5 - 5.1 mmol/L   Chloride 105 101 - 111 mmol/L   CO2 21 (L) 22 - 32 mmol/L   Glucose, Bld 108 (H) 65 - 99 mg/dL   BUN 5 (L) 6 - 20 mg/dL   Creatinine, Ser 1.19 0.44 - 1.00 mg/dL   Calcium 8.9 8.9 - 14.7 mg/dL   Total Protein 6.7 6.5 - 8.1 g/dL   Albumin 3.0 (L) 3.5 - 5.0  g/dL   AST 31 15 - 41 U/L   ALT 55 (H) 14 - 54 U/L   Alkaline Phosphatase 92 38 - 126 U/L   Total Bilirubin 0.3 0.3 - 1.2 mg/dL   GFR calc non Af Amer >60 >60 mL/min   GFR calc Af Amer >60 >60 mL/min   Anion gap 10 5 - 15  CBC     Status: None   Collection Time: 08/02/15  2:23 AM  Result Value Ref Range   WBC 10.4 4.0 - 10.5 K/uL   RBC 4.19 3.87 - 5.11 MIL/uL   Hemoglobin 13.2 12.0 - 15.0 g/dL   HCT 82.9 56.2 - 13.0 %   MCV 90.5 78.0 - 100.0 fL   MCH 31.5 26.0 - 34.0 pg   MCHC 34.8 30.0 - 36.0 g/dL   RDW 86.5 78.4 - 69.6 %   Platelets 224 150 - 400 K/uL  Amylase     Status: None   Collection Time: 08/02/15  2:23 AM  Result Value Ref Range   Amylase 44 28 - 100 U/L  Lipase, blood     Status: Abnormal   Collection Time: 08/02/15  2:23 AM  Result Value Ref Range   Lipase 20 (L) 22 - 51 U/L   EKG - normal sinus rhythm  0300 Consulted with Dr. Henderson Cloud > Reviewed HPI/Exam/labs/medical and surgical history > obtain abdominal ultrasound (limited)  Ultrasound: FINDINGS: Gallbladder:  Dependent mid level echoes with discrete fluid fluid level. The gallbladder is reportedly tender but there is no wall thickening or stone.  Common bile duct:  Diameter: 4 mm  Liver:  No focal lesion identified. Within normal limits in parenchymal echogenicity. Antegrade flow in the imaged hepatic and portal venous system.  IMPRESSION: Gallbladder sludge. No cholelithiasis or cholecystitis. Assessment and Plan  34 y.o. G3P2 at [redacted]w[redacted]d IUP Abdominal Pain - unknown etiology  Plan: Discharge to home Follow-up with GI provider if no resolution or worsening of symptoms  Elenora Fender [facility] 08/02/2015, 2:19 AM

## 2015-08-02 NOTE — MAU Note (Signed)
Patient arrived via EMS and presents to MAU with c/o upper mid abdominal pain that started about an hour ago. Patient states "It felt like I was punched and the wind got knocked out of me". She states she had a hard time taking a deep breath. Sats 98-99% RA at presents. Reports nausea but no vomiting.

## 2015-08-02 NOTE — Discharge Instructions (Signed)

## 2015-11-06 ENCOUNTER — Inpatient Hospital Stay (HOSPITAL_COMMUNITY)
Admission: AD | Admit: 2015-11-06 | Discharge: 2015-11-06 | Disposition: A | Discharge status: Home / Self Care | Payer: Medicaid Other | Source: Ambulatory Visit | Attending: Obstetrics and Gynecology | Admitting: Obstetrics and Gynecology

## 2015-11-06 ENCOUNTER — Encounter (HOSPITAL_COMMUNITY): Payer: Self-pay | Admitting: *Deleted

## 2015-11-06 DIAGNOSIS — M549 Dorsalgia, unspecified: Secondary | ICD-10-CM | POA: Diagnosis not present

## 2015-11-06 DIAGNOSIS — O4703 False labor before 37 completed weeks of gestation, third trimester: Secondary | ICD-10-CM | POA: Diagnosis not present

## 2015-11-06 DIAGNOSIS — Z3A34 34 weeks gestation of pregnancy: Secondary | ICD-10-CM | POA: Insufficient documentation

## 2015-11-06 DIAGNOSIS — O9989 Other specified diseases and conditions complicating pregnancy, childbirth and the puerperium: Secondary | ICD-10-CM

## 2015-11-06 DIAGNOSIS — O99891 Other specified diseases and conditions complicating pregnancy: Secondary | ICD-10-CM

## 2015-11-06 LAB — URINALYSIS, ROUTINE W REFLEX MICROSCOPIC
Bilirubin Urine: NEGATIVE
GLUCOSE, UA: 100 mg/dL — AB
Hgb urine dipstick: NEGATIVE
KETONES UR: NEGATIVE mg/dL
NITRITE: NEGATIVE
PH: 6.5 (ref 5.0–8.0)
Protein, ur: NEGATIVE mg/dL
SPECIFIC GRAVITY, URINE: 1.015 (ref 1.005–1.030)

## 2015-11-06 LAB — URINE MICROSCOPIC-ADD ON: RBC / HPF: NONE SEEN RBC/hpf (ref 0–5)

## 2015-11-06 MED ORDER — NIFEDIPINE 10 MG PO CAPS
20.0000 mg | ORAL_CAPSULE | Freq: Once | ORAL | Status: AC
  Administered 2015-11-06: 20 mg via ORAL
  Filled 2015-11-06: qty 2

## 2015-11-06 MED ORDER — NIFEDIPINE 10 MG PO CAPS: 10.0000 mg | ORAL_CAPSULE | ORAL | Status: DC | PRN

## 2015-11-06 NOTE — MAU Note (Addendum)
Ctxs for 2.5hrs. Denies LOF or bleeding. First del at 36wks. Had PTL with second delivery but del at 38wks. Drank water and rested and intensity has decreased and spread out but Dr Tenny Crawoss sent in to be checked

## 2015-11-06 NOTE — MAU Note (Signed)
FFN not sent to lab per dr. order

## 2015-11-06 NOTE — MAU Note (Signed)
ffn collected by Sharen CounterLisa Leftwich Kirby CNM

## 2015-11-06 NOTE — Discharge Instructions (Signed)
Abdominal Pain During Pregnancy °Abdominal pain is common in pregnancy. Most of the time, it does not cause harm. There are many causes of abdominal pain. Some causes are more serious than others. Some of the causes of abdominal pain in pregnancy are easily diagnosed. Occasionally, the diagnosis takes time to understand. Other times, the cause is not determined. Abdominal pain can be a sign that something is very wrong with the pregnancy, or the pain may have nothing to do with the pregnancy at all. For this reason, always tell your health care provider if you have any abdominal discomfort. °HOME CARE INSTRUCTIONS  °Monitor your abdominal pain for any changes. The following actions may help to alleviate any discomfort you are experiencing: °· Do not have sexual intercourse or put anything in your vagina until your symptoms go away completely. °· Get plenty of rest until your pain improves. °· Drink clear fluids if you feel nauseous. Avoid solid food as long as you are uncomfortable or nauseous. °· Only take over-the-counter or prescription medicine as directed by your health care provider. °· Keep all follow-up appointments with your health care provider. °SEEK IMMEDIATE MEDICAL CARE IF: °· You are bleeding, leaking fluid, or passing tissue from the vagina. °· You have increasing pain or cramping. °· You have persistent vomiting. °· You have painful or bloody urination. °· You have a fever. °· You notice a decrease in your baby's movements. °· You have extreme weakness or feel faint. °· You have shortness of breath, with or without abdominal pain. °· You develop a severe headache with abdominal pain. °· You have abnormal vaginal discharge with abdominal pain. °· You have persistent diarrhea. °· You have abdominal pain that continues even after rest, or gets worse. °MAKE SURE YOU:  °· Understand these instructions. °· Will watch your condition. °· Will get help right away if you are not doing well or get worse. °    °This information is not intended to replace advice given to you by your health care provider. Make sure you discuss any questions you have with your health care provider. °  °Document Released: 12/04/2005 Document Revised: 09/24/2013 Document Reviewed: 07/03/2013 °Elsevier Interactive Patient Education ©2016 [employer] Inc[employer] °Back Pain in Pregnancy °Back pain during pregnancy is common. It happens in about half of all pregnancies. It is important for you and your baby that you remain active during your pregnancy. If you feel that back pain is not allowing you to remain active or sleep well, it is time to see your caregiver. Back pain may be caused by several factors related to changes during your pregnancy. Fortunately, unless you had trouble with your back before your pregnancy, the pain is likely to get better after you deliver. °Low back pain usually occurs between the fifth and seventh months of pregnancy. It can, however, happen in the first couple months. Factors that increase the risk of back problems include:  °· Previous back problems. °· Injury to your back. °· Having twins or multiple births. °· A chronic cough. °· Stress. °· Job-related repetitive motions. °· Muscle or spinal disease in the back. °· Family history of back problems, ruptured (herniated) discs, or osteoporosis. °· Depression, anxiety, and panic attacks. °CAUSES  °· When you are pregnant, your body produces a hormone called relaxin. This hormone makes the ligaments connecting the low back and pubic bones more flexible. This flexibility allows the baby to be delivered more easily. When your ligaments are loose, your muscles need to work harder to support your   back. Soreness in your back can come from tired muscles. Soreness can also come from back tissues that are irritated since they are receiving less support. °· As the baby grows, it puts pressure on the nerves and blood vessels in your pelvis. This can cause back pain. °· As the baby  grows and gets heavier during pregnancy, the uterus pushes the stomach muscles forward and changes your center of gravity. This makes your back muscles work harder to maintain good posture. °SYMPTOMS  °Lumbar pain during pregnancy °Lumbar pain during pregnancy usually occurs at or above the waist in the center of the back. There may be pain and numbness that radiates into your leg or foot. This is similar to low back pain experienced by non-pregnant women. It usually increases with sitting for long periods of time, standing, or repetitive lifting. Tenderness may also be present in the muscles along your upper back. °Posterior pelvic pain during pregnancy °Pain in the back of the pelvis is more common than lumbar pain in pregnancy. It is a deep pain felt in your side at the waistline, or across the tailbone (sacrum), or in both places. You may have pain on one or both sides. This pain can also go into the buttocks and backs of the upper thighs. Pubic and groin pain may also be present. The pain does not quickly resolve with rest, and morning stiffness may also be present. °Pelvic pain during pregnancy can be brought on by most activities. A high level of fitness before and during pregnancy may or may not prevent this problem. Labor pain is usually 1 to 2 minutes apart, lasts for about 1 minute, and involves a bearing down feeling or pressure in your pelvis. However, if you are at term with the pregnancy, constant low back pain can be the beginning of early labor, and you should be aware of this. °DIAGNOSIS  °X-rays of the back should not be done during the first 12 to 14 weeks of the pregnancy and only when absolutely necessary during the rest of the pregnancy. MRIs do not give off radiation and are safe during pregnancy. MRIs also should only be done when absolutely necessary. °HOME CARE INSTRUCTIONS °· Exercise as directed by your caregiver. Exercise is the most effective way to prevent or manage back pain. If you  have a back problem, it is especially important to avoid sports that require sudden body movements. Swimming and walking are great activities. °· Do not stand in one place for long periods of time. °· Do not wear high heels. °· Sit in chairs with good posture. Use a pillow on your lower back if necessary. Make sure your head rests over your shoulders and is not hanging forward. °· Try sleeping on your side, preferably the left side, with a pillow or two between your legs. If you are sore after a night's rest, your bed may be too soft. Try placing a board between your mattress and box spring. °· Listen to your body when lifting. If you are experiencing pain, ask for help or try bending your knees more so you can use your leg muscles rather than your back muscles. Squat down when picking up something from the floor. Do not bend over. °· Eat a healthy diet. Try to gain weight within your caregiver's recommendations. °· Use heat or cold packs 3 to 4 times a day for 15 minutes to help with the pain. °· Only take over-the-counter or prescription medicines for pain, discomfort, or fever as directed by your caregiver. °  Sudden (acute) back pain °· Use bed rest for only the most extreme, acute episodes of back pain. Prolonged bed rest over 48 hours will aggravate your condition. °· Ice is very effective for acute conditions. °¨ Put ice in a plastic bag. °¨ Place a towel between your skin and the bag. °¨ Leave the ice on for 10 to 20 minutes every 2 hours, or as needed. °· Using heat packs for 30 minutes prior to activities is also helpful. °Continued back pain °See your caregiver if you have continued problems. Your caregiver can help or refer you for appropriate physical therapy. With conditioning, most back problems can be avoided. Sometimes, a more serious issue may be the cause of back pain. You should be seen right away if new problems seem to be developing. Your caregiver may recommend: °· A maternity girdle. °· An  elastic sling. °· A back brace. °· A massage therapist or acupuncture. °SEEK MEDICAL CARE IF:  °· You are not able to do most of your daily activities, even when taking the pain medicine you were given. °· You need a referral to a physical therapist or chiropractor. °· You want to try acupuncture. °SEEK IMMEDIATE MEDICAL CARE IF: °· You develop numbness, tingling, weakness, or problems with the use of your arms or legs. °· You develop severe back pain that is no longer relieved with medicines. °· You have a sudden change in bowel or bladder control. °· You have increasing pain in other areas of the body. °· You develop shortness of breath, dizziness, or fainting. °· You develop nausea, vomiting, or sweating. °· You have back pain which is similar to labor pains. °· You have back pain along with your water breaking or vaginal bleeding. °· You have back pain or numbness that travels down your leg. °· Your back pain developed after you fell. °· You develop pain on one side of your back. You may have a kidney stone. °· You see blood in your urine. You may have a bladder infection or kidney stone. °· You have back pain with blisters. You may have shingles. °Back pain is fairly common during pregnancy but should not be accepted as just part of the process. Back pain should always be treated as soon as possible. This will make your pregnancy as pleasant as possible. °  °This information is not intended to replace advice given to you by your health care provider. Make sure you discuss any questions you have with your health care provider. °  °Document Released: 03/14/2006 Document Revised: 02/26/2012 Document Reviewed: 04/25/2011 °Elsevier Interactive Patient Education ©2016 Elsevier Inc. ° °

## 2015-11-06 NOTE — MAU Provider Note (Signed)
Chief Complaint:  Contractions   First Provider Initiated Contact with Patient 11/06/15 2106      HPI: [name] is a 34 y.o. G3P2 at 44w4dwho presents to maternity admissions reporting onset of painful contractions around 5 pm today and lasting 2.5 hours before coming to MAU.  She tried increasing PO fluids, resting, and changing positions but contractions continued.  By the time she arrived in MAU, she reports contractions are less painful but still occuring every few minutes.  She has hx of preterm birth x 1 at 36 weeks with term delivery with her last pregnancy.   She reports good fetal movement, denies LOF, vaginal bleeding, vaginal itching/burning, urinary symptoms, h/a, dizziness, n/v, or fever/chills.    HPI  Past Medical History: Past Medical History  Diagnosis Date  . Depression   . Migraines   . LGSIL (low grade squamous intraepithelial lesion) on Pap smear 08/2004  . GERD (gastroesophageal reflux disease)     Past obstetric history: OB History  Gravida Para Term Preterm AB SAB TAB Ectopic Multiple Living  # Outcome Date GA Lbr Len/2nd Weight Sex Delivery Anes PTL Lv  3 Current           2 Para           1 Para               Past Surgical History: Past Surgical History  Procedure Laterality Date  . Nasal sinus surgery    . Nissenfundo plication  2005    Family History: Family History  Problem Relation Age of Onset  . Hypertension Mother   . Endometriosis Sister   . Cancer Maternal Grandmother   . Diabetes Maternal Grandfather     Social History: Social History  Substance Use Topics  . Smoking status: Never Smoker   . Smokeless tobacco: Never Used  . Alcohol Use: No     Comment: occ    Allergies:  Allergies  Allergen Reactions  . Nsaids Hives, Swelling and Other (See Comments)    Pt states that her lips swelled.    . Sulfa Antibiotics Swelling and Other (See Comments)    Pt states that her lips swelled.    . Latex Rash     Meds:  No prescriptions prior to admission    ROS:  Review of Systems  Constitutional: Negative for fever, chills and fatigue.  HENT: Negative for sinus pressure.   Eyes: Negative for photophobia.  Respiratory: Negative for shortness of breath.   Cardiovascular: Negative for chest pain.  Gastrointestinal: Negative for nausea, vomiting, diarrhea and constipation.  Genitourinary: Negative for dysuria, frequency, flank pain, vaginal bleeding, vaginal discharge, difficulty urinating, vaginal pain and pelvic pain.  Musculoskeletal: Negative for neck pain.  Neurological: Negative for dizziness, weakness and headaches.  Psychiatric/Behavioral: Negative.      I have reviewed patient's Past Medical Hx, Surgical Hx, Family Hx, Social Hx, medications and allergies.   Physical Exam   Patient Vitals for the past 24 hrs:  BP Temp Temp src Pulse Resp Height Weight  11/06/15 2255 124/76 mmHg 97.8 F (36.6 C) Oral 99 16 - -  11/06/15 2124 122/75 mmHg - - 103 - - -  11/06/15 2022 147/77 mmHg 98.4 F (36.9 C) - 91 18  (1.676 m) 249 lb 6.4 oz (113.127 kg)   Constitutional: Well-developed, well-nourished female in no acute distress.  Cardiovascular: normal rate Respiratory: normal  effort GI: Abd soft, non-tender, gravid appropriate for gestational age.  MS: Extremities nontender, no edema, normal ROM Neurologic: Alert and oriented x 4.  GU: Neg CVAT.  Dilation: Closed Effacement (%): Thick Cervical Position: Anterior Exam by:: Sharen CounterLisa Leftwich kirby CNM  FHT:  Baseline 150, moderate variability, accelerations present, no decelerations Contractions: irritability every 1-5 minutes, mild to palpation   Labs: Results for orders placed or performed during the hospital encounter of 11/06/15 (from the past 24 hour(s))  Urinalysis, Routine w reflex microscopic (not at [facility])     Status: Abnormal   Collection Time: 11/06/15  8:27 PM  Result Value Ref Range   Color, Urine YELLOW YELLOW    APPearance CLEAR CLEAR   Specific Gravity, Urine 1.015 1.005 - 1.030   pH 6.5 5.0 - 8.0   Glucose, UA 100 (A) NEGATIVE mg/dL   Hgb urine dipstick NEGATIVE NEGATIVE   Bilirubin Urine NEGATIVE NEGATIVE   Ketones, ur NEGATIVE NEGATIVE mg/dL   Protein, ur NEGATIVE NEGATIVE mg/dL   Nitrite NEGATIVE NEGATIVE   Leukocytes, UA SMALL (A) NEGATIVE  Urine microscopic-add on     Status: Abnormal   Collection Time: 11/06/15  8:27 PM  Result Value Ref Range   Squamous Epithelial / LPF 6-30 (A) NONE SEEN   WBC, UA 6-30 0 - 5 WBC/hpf   RBC / HPF NONE SEEN 0 - 5 RBC/hpf   Bacteria, UA MANY (A) NONE SEEN   Urine-Other MUCOUS PRESENT       Imaging:  No results found.  MAU Course/MDM: I have ordered labs and reviewed results. Reviewed FHR tracing.  Consult Dr Tenny Crawoss to review assessment, labs, FHR tracing.  Treatments in MAU included Procardia 20 mg PO x 1.  Pt reported some improvement in symptoms after medication but does continue to have constant back pain and some mild cramping.  Reports she is able to rest now, when she couldn't get comfortable before treatment.  Discussed use of heat/ice/warm bath/Tylenol for pain and return to MAU if contractions worsen/persist.  F/U in office next week. Pt stable at time of discharge.  Assessment: 1. Threatened preterm labor, third trimester   2. Back pain affecting pregnancy in third trimester     Plan: Discharge home Preterm precautions and fetal kick counts Procardia 10 mg PO Q 4 hours PRN Increase PO fluids, rest/ice/heat/Tylenol for pain      Follow-up Information    Follow up with Almon HerculesOSS,KENDRA H., MD.   Specialty:  Obstetrics and Gynecology   Why:  As scheduled this week, or call on Monday if symptoms persist over the weekend.  Return to MAU as needed for emergencies   Contact information:   [address] [place] [id number] [phone]        Medication List    TAKE these medications        NIFEdipine 10 MG capsule   Commonly known as:  PROCARDIA  Take 1 capsule (10 mg total) by mouth every 4 (four) hours as needed.     ondansetron 4 MG disintegrating tablet  Commonly known as:  ZOFRAN-ODT  Take 4 mg by mouth every 8 (eight) hours as needed for nausea or vomiting.     prenatal multivitamin Tabs tablet  Take 1 tablet by mouth See admin instructions. Pt takes two days on and one day off.        Sharen CounterLisa Leftwich-Kirby Certified Nurse-Midwife 11/06/2015 11:21 PM

## 2015-11-08 LAB — CULTURE, OB URINE

## 2015-11-22 ENCOUNTER — Inpatient Hospital Stay (HOSPITAL_COMMUNITY)
Admission: AD | Admit: 2015-11-22 | Discharge: 2015-11-22 | Disposition: A | Discharge status: Home / Self Care | Payer: 59 | Source: Ambulatory Visit | Attending: Obstetrics and Gynecology | Admitting: Obstetrics and Gynecology

## 2015-11-22 ENCOUNTER — Encounter (HOSPITAL_COMMUNITY): Payer: Self-pay

## 2015-11-22 DIAGNOSIS — Z3A35 35 weeks gestation of pregnancy: Secondary | ICD-10-CM | POA: Diagnosis not present

## 2015-11-22 DIAGNOSIS — O4703 False labor before 37 completed weeks of gestation, third trimester: Secondary | ICD-10-CM | POA: Diagnosis not present

## 2015-11-22 DIAGNOSIS — O479 False labor, unspecified: Secondary | ICD-10-CM

## 2015-11-22 NOTE — Discharge Instructions (Signed)
Braxton Hicks Contractions °Contractions of the uterus can occur throughout pregnancy. Contractions are not always a sign that you are in labor.  °WHAT ARE BRAXTON HICKS CONTRACTIONS?  °Contractions that occur before labor are called Braxton Hicks contractions, or false labor. Toward the end of pregnancy (32-34 weeks), these contractions can develop more often and may become more forceful. This is not true labor because these contractions do not result in opening (dilatation) and thinning of the cervix. They are sometimes difficult to tell apart from true labor because these contractions can be forceful and people have different pain tolerances. You should not feel embarrassed if you go to the hospital with false labor. Sometimes, the only way to tell if you are in true labor is for your health care provider to look for changes in the cervix. °If there are no prenatal problems or other health problems associated with the pregnancy, it is completely safe to be sent home with false labor and await the onset of true labor. °HOW CAN YOU TELL THE DIFFERENCE BETWEEN TRUE AND FALSE LABOR? °False Labor °· The contractions of false labor are usually shorter and not as hard as those of true labor.   °· The contractions are usually irregular.   °· The contractions are often felt in the front of the lower abdomen and in the groin.   °· The contractions may go away when you walk around or change positions while lying down.   °· The contractions get weaker and are shorter lasting as time goes on.   °· The contractions do not usually become progressively stronger, regular, and closer together as with true labor.   °True Labor °· Contractions in true labor last 30-70 seconds, become very regular, usually become more intense, and increase in frequency.   °· The contractions do not go away with walking.   °· The discomfort is usually felt in the top of the uterus and spreads to the lower abdomen and low back.   °· True labor can be  determined by your health care provider with an exam. This will show that the cervix is dilating and getting thinner.   °WHAT TO REMEMBER °· Keep up with your usual exercises and follow other instructions given by your health care provider.   °· Take medicines as directed by your health care provider.   °· Keep your regular prenatal appointments.   °· Eat and drink lightly if you think you are going into labor.   °· If Braxton Hicks contractions are making you uncomfortable:   °¨ Change your position from lying down or resting to walking, or from walking to resting.   °¨ Sit and rest in a tub of warm water.   °¨ Drink 2-3 glasses of water. Dehydration may cause these contractions.   °¨ Do slow and deep breathing several times an hour.   °WHEN SHOULD I SEEK IMMEDIATE MEDICAL CARE? °Seek immediate medical care if: °· Your contractions become stronger, more regular, and closer together.   °· You have fluid leaking or gushing from your vagina.   °· You have a fever.   °· You pass blood-tinged mucus.   °· You have vaginal bleeding.   °· You have continuous abdominal pain.   °· You have low back pain that you never had before.   °· You feel your baby's head pushing down and causing pelvic pressure.   °· Your baby is not moving as much as it used to.   °  °This information is not intended to replace advice given to you by your health care provider. Make sure you discuss any questions you have with your health care   provider. °  °Document Released: 12/04/2005 Document Revised: 12/09/2013 Document Reviewed: 09/15/2013 °Elsevier Interactive Patient Education ©2016 [employer] °Fetal Movement Counts °Patient Name: __________________________________________________ Patient Due Date: ____________________ °Performing a fetal movement count is highly recommended in high-risk pregnancies, but it is good for every pregnant woman to do. Your health care provider may ask you to start counting fetal movements at 28 weeks of the  pregnancy. Fetal movements often increase: °· After eating a full meal. °· After physical activity. °· After eating or drinking something sweet or cold. °· At rest. °Pay attention to when you feel the baby is most active. This will help you notice a pattern of your baby's sleep and wake cycles and what factors contribute to an increase in fetal movement. It is important to perform a fetal movement count at the same time each day when your baby is normally most active.  °HOW TO COUNT FETAL MOVEMENTS °· Find a quiet and comfortable area to sit or lie down on your left side. Lying on your left side provides the best blood and oxygen circulation to your baby. °· Write down the day and time on a sheet of paper or in a journal. °· Start counting kicks, flutters, swishes, rolls, or jabs in a 2-hour period. You should feel at least 10 movements within 2 hours. °· If you do not feel 10 movements in 2 hours, wait 2-3 hours and count again. Look for a change in the pattern or not enough counts in 2 hours. °SEEK MEDICAL CARE IF: °· You feel less than 10 counts in 2 hours, tried twice. °· There is no movement in over an hour. °· The pattern is changing or taking longer each day to reach 10 counts in 2 hours. °· You feel the baby is not moving as he or she usually does. °Date: ____________ Movements: ____________ Start time: ____________ Finish time: ____________  °Date: ____________ Movements: ____________ Start time: ____________ Finish time: ____________ °Date: ____________ Movements: ____________ Start time: ____________ Finish time: ____________ °Date: ____________ Movements: ____________ Start time: ____________ Finish time: ____________ °Date: ____________ Movements: ____________ Start time: ____________ Finish time: ____________ °Date: ____________ Movements: ____________ Start time: ____________ Finish time: ____________ °Date: ____________ Movements: ____________ Start time: ____________ Finish time: ____________ °Date:  ____________ Movements: ____________ Start time: ____________ Finish time: ____________  °Date: ____________ Movements: ____________ Start time: ____________ Finish time: ____________ °Date: ____________ Movements: ____________ Start time: ____________ Finish time: ____________ °Date: ____________ Movements: ____________ Start time: ____________ Finish time: ____________ °Date: ____________ Movements: ____________ Start time: ____________ Finish time: ____________ °Date: ____________ Movements: ____________ Start time: ____________ Finish time: ____________ °Date: ____________ Movements: ____________ Start time: ____________ Finish time: ____________ °Date: ____________ Movements: ____________ Start time: ____________ Finish time: ____________  °Date: ____________ Movements: ____________ Start time: ____________ Finish time: ____________ °Date: ____________ Movements: ____________ Start time: ____________ Finish time: ____________ °Date: ____________ Movements: ____________ Start time: ____________ Finish time: ____________ °Date: ____________ Movements: ____________ Start time: ____________ Finish time: ____________ °Date: ____________ Movements: ____________ Start time: ____________ Finish time: ____________ °Date: ____________ Movements: ____________ Start time: ____________ Finish time: ____________ °Date: ____________ Movements: ____________ Start time: ____________ Finish time: ____________  °Date: ____________ Movements: ____________ Start time: ____________ Finish time: ____________ °Date: ____________ Movements: ____________ Start time: ____________ Finish time: ____________ °Date: ____________ Movements: ____________ Start time: ____________ Finish time: ____________ °Date: ____________ Movements: ____________ Start time: ____________ Finish time: ____________ °Date: ____________ Movements: ____________ Start time: ____________ Finish time: ____________ °Date: ____________ Movements: ____________ Start  time: ____________ Finish time:   ____________ °Date: ____________ Movements: ____________ Start time: ____________ Finish time: ____________  °Date: ____________ Movements: ____________ Start time: ____________ Finish time: ____________ °Date: ____________ Movements: ____________ Start time: ____________ Finish time: ____________ °Date: ____________ Movements: ____________ Start time: ____________ Finish time: ____________ °Date: ____________ Movements: ____________ Start time: ____________ Finish time: ____________ °Date: ____________ Movements: ____________ Start time: ____________ Finish time: ____________ °Date: ____________ Movements: ____________ Start time: ____________ Finish time: ____________ °Date: ____________ Movements: ____________ Start time: ____________ Finish time: ____________  °Date: ____________ Movements: ____________ Start time: ____________ Finish time: ____________ °Date: ____________ Movements: ____________ Start time: ____________ Finish time: ____________ °Date: ____________ Movements: ____________ Start time: ____________ Finish time: ____________ °Date: ____________ Movements: ____________ Start time: ____________ Finish time: ____________ °Date: ____________ Movements: ____________ Start time: ____________ Finish time: ____________ °Date: ____________ Movements: ____________ Start time: ____________ Finish time: ____________ °Date: ____________ Movements: ____________ Start time: ____________ Finish time: ____________  °Date: ____________ Movements: ____________ Start time: ____________ Finish time: ____________ °Date: ____________ Movements: ____________ Start time: ____________ Finish time: ____________ °Date: ____________ Movements: ____________ Start time: ____________ Finish time: ____________ °Date: ____________ Movements: ____________ Start time: ____________ Finish time: ____________ °Date: ____________ Movements: ____________ Start time: ____________ Finish time:  ____________ °Date: ____________ Movements: ____________ Start time: ____________ Finish time: ____________ °Date: ____________ Movements: ____________ Start time: ____________ Finish time: ____________  °Date: ____________ Movements: ____________ Start time: ____________ Finish time: ____________ °Date: ____________ Movements: ____________ Start time: ____________ Finish time: ____________ °Date: ____________ Movements: ____________ Start time: ____________ Finish time: ____________ °Date: ____________ Movements: ____________ Start time: ____________ Finish time: ____________ °Date: ____________ Movements: ____________ Start time: ____________ Finish time: ____________ °Date: ____________ Movements: ____________ Start time: ____________ Finish time: ____________ °  °This information is not intended to replace advice given to you by your health care provider. Make sure you discuss any questions you have with your health care provider. °  °Document Released: 01/03/2007 Document Revised: 12/25/2014 Document Reviewed: 09/30/2012 °Elsevier Interactive Patient Education ©2016 [employer] Inc[employer] ° °

## 2015-11-22 NOTE — MAU Note (Signed)
Pt c/o contractions that started at 5pm and are every 4-6 mins and lots of pelvic pressure. Denies LOF or vag bleeding. +FM. States she has hx of PTL.

## 2015-11-22 NOTE — MAU Provider Note (Signed)
History     CSN: [id number]  Arrival date and time: 11/22/15 2027   First Provider Initiated Contact with Patient 11/22/15 2129      Chief Complaint  Patient presents with  . Contractions   HPI Comments: Buelah ManisHeather L Meir is a 34 y.o. [id number] at [id number] who presents today with contractions. She states that the contractions started around 1700. She states that originally they were about 5mins apart, but they are decreasing now. She denies any vaginal bleeding or leaking of fluid. She states that the fetus has been active. She was seen here about two weeks ago, and was dilated about FT. She has not been checked since then. She has an appointment on Thursday in office.   Abdominal Pain This is a new problem. The current episode started today. The onset quality is gradual. The problem occurs intermittently. The problem has been gradually improving. The pain is located in the suprapubic region. The pain is at a severity of 3/10. The quality of the pain is cramping. The abdominal pain does not radiate. Pertinent negatives include no constipation, diarrhea, dysuria, fever, frequency, nausea or vomiting. Nothing aggravates the pain. The pain is relieved by nothing.      Past Medical History  Diagnosis Date  . Depression   . Migraines   . LGSIL (low grade squamous intraepithelial lesion) on Pap smear 08/2004  . GERD (gastroesophageal reflux disease)     Past Surgical History  Procedure Laterality Date  . Nasal sinus surgery    . Nissenfundo plication  2005    Family History  Problem Relation Age of Onset  . Hypertension Mother   . Endometriosis Sister   . Cancer Maternal Grandmother   . Diabetes Maternal Grandfather     Social History  Substance Use Topics  . Smoking status: Never Smoker   . Smokeless tobacco: Never Used  . Alcohol Use: No     Comment: occ    Allergies:  Allergies  Allergen Reactions  . Nsaids Hives, Swelling and Other (See Comments)    Pt states that her  lips swelled.    . Sulfa Antibiotics Swelling and Other (See Comments)    Pt states that her lips swelled.    . Latex Rash    Prescriptions prior to admission  Medication Sig Dispense Refill Last Dose  . acetaminophen (TYLENOL) 325 MG tablet Take 650 mg by mouth every 6 (six) hours as needed for mild pain or headache.    Past Week at Unknown time  . ondansetron (ZOFRAN-ODT) 4 MG disintegrating tablet Take 4 mg by mouth every 8 (eight) hours as needed for nausea or vomiting.   Past Month at Unknown time  . Prenatal Vit-Fe Fumarate-FA (PRENATAL MULTIVITAMIN) TABS tablet Take 1 tablet by mouth See admin instructions. Pt takes two days on and one day off.   11/21/2015 at Unknown time  . NIFEdipine (PROCARDIA) 10 MG capsule Take 1 capsule (10 mg total) by mouth every 4 (four) hours as needed. (Patient not taking: Reported on 11/22/2015) 30 capsule 0 Not Taking at Unknown time    Review of Systems  Constitutional: Negative for fever and chills.  Gastrointestinal: Positive for abdominal pain. Negative for nausea, vomiting, diarrhea and constipation.  Genitourinary: Negative for dysuria, urgency and frequency.   Physical Exam   Blood pressure 121/86, pulse 107, temperature 98.6 F (37 C), temperature source Oral, resp. rate 16, height 5\' 5"  (1.651 m), weight 112.492 kg (248 lb), last menstrual period 03/09/2015, SpO2 99 %.  Physical Exam  Nursing note and vitals reviewed. Constitutional: She is oriented to person, place, and time. She appears well-developed and well-nourished. No distress.  HENT:  Head: Normocephalic.  Eyes: Pupils are equal, round, and reactive to light.  Cardiovascular: Normal rate.   Respiratory: Effort normal.  GI: Soft. There is no tenderness. There is no rebound.  Genitourinary:   Cervix: FT/70/-2  Neurological: She is alert and oriented to person, place, and time.  Skin: Skin is warm and dry.  Psychiatric: She has a normal mood and affect.   FHT: 130, moderate  with 15x15 accels, no decels Toco: rare UC  MAU Course  Procedures  MDM RN called report to attending MD on call.   Assessment and Plan   1. Braxton Hicks contractions    DC home Comfort measures reviewed  3rd Trimester precautions  PTL precautions  Fetal kick counts RX: none  Return to MAU as needed FU with OB as planned  Follow-up Information    Follow up with Almon Hercules., MD.   Specialty:  Obstetrics and Gynecology   Why:  as scheduled on Thursday. Return to Maternity Admissions as needed for emergencies.   Contact information:   [address] [place] [address] [phone]         [place], [place] 11/22/2015, 9:31 PM

## 2015-11-24 ENCOUNTER — Encounter (HOSPITAL_COMMUNITY): Payer: Self-pay

## 2015-11-24 ENCOUNTER — Inpatient Hospital Stay (HOSPITAL_COMMUNITY)
Admission: AD | Admit: 2015-11-24 | Discharge: 2015-11-24 | Disposition: A | Discharge status: Home / Self Care | Payer: 59 | Source: Ambulatory Visit | Attending: Obstetrics | Admitting: Obstetrics

## 2015-11-24 DIAGNOSIS — O4703 False labor before 37 completed weeks of gestation, third trimester: Secondary | ICD-10-CM | POA: Diagnosis not present

## 2015-11-24 DIAGNOSIS — Z3A35 35 weeks gestation of pregnancy: Secondary | ICD-10-CM | POA: Diagnosis not present

## 2015-11-24 LAB — URINALYSIS, ROUTINE W REFLEX MICROSCOPIC
Bilirubin Urine: NEGATIVE
GLUCOSE, UA: NEGATIVE mg/dL
HGB URINE DIPSTICK: NEGATIVE
KETONES UR: 40 mg/dL — AB
Nitrite: NEGATIVE
PH: 5.5 (ref 5.0–8.0)
PROTEIN: NEGATIVE mg/dL
Specific Gravity, Urine: >1.03 — ABNORMAL HIGH (ref 1.005–1.030)

## 2015-11-24 LAB — URINE MICROSCOPIC-ADD ON: RBC / HPF: NONE SEEN RBC/hpf (ref 0–5)

## 2015-11-24 NOTE — Discharge Instructions (Signed)

## 2015-11-24 NOTE — MAU Provider Note (Signed)
Chief Complaint:  No chief complaint on file.   First Provider Initiated Contact with Patient 11/24/15 2032      HPI: [name] is a 33 y.o. [id number] at [redacted]w[redacted]d who presents to maternity admissions reporting contractions every 10 minutes x several hours tonight. She reports her last labor was fast and is worried about a fast labor this pregnancy. Her husband is out of town and wanted her to come in to be evaluated to determine whether he should come back home tonight.  She describes the contractions as lower abdominal cramping that is intermittent and not improved by heat, rest, or position change.  She has not tried any medications.   She reports good fetal movement, denies LOF, vaginal bleeding, vaginal itching/burning, urinary symptoms, h/a, dizziness, n/v, or fever/chills.    HPI  Past Medical History: Past Medical History  Diagnosis Date  . Depression   . Migraines   . LGSIL (low grade squamous intraepithelial lesion) on Pap smear 08/2004  . GERD (gastroesophageal reflux disease)     Past obstetric history: OB History  Gravida Para Term Preterm AB SAB TAB Ectopic Multiple Living  # Outcome Date GA Lbr Len/2nd Weight Sex Delivery Anes PTL Lv  3 Current           2 Term      Vag-Spont     1 Preterm      Vag-Spont         Past Surgical History: Past Surgical History  Procedure Laterality Date  . Nasal sinus surgery    . Nissenfundo plication  2005    Family History: Family History  Problem Relation Age of Onset  . Hypertension Mother   . Endometriosis Sister   . Cancer Maternal Grandmother   . Diabetes Maternal Grandfather     Social History: Social History  Substance Use Topics  . Smoking status: Never Smoker   . Smokeless tobacco: Never Used  . Alcohol Use: No     Comment: occ    Allergies:  Allergies  Allergen Reactions  . Nsaids Hives, Swelling and Other (See Comments)    Pt states that her lips swelled.    . Sulfa Antibiotics  Swelling and Other (See Comments)    Pt states that her lips swelled.    . Latex Rash    Meds:  Prescriptions prior to admission  Medication Sig Dispense Refill Last Dose  . acetaminophen (TYLENOL) 325 MG tablet Take 650 mg by mouth every 6 (six) hours as needed for mild pain or headache.    Past Week at Unknown time  . ondansetron (ZOFRAN-ODT) 4 MG disintegrating tablet Take 4 mg by mouth every 8 (eight) hours as needed for nausea or vomiting.   Past Month at Unknown time  . Prenatal Vit-Fe Fumarate-FA (PRENATAL MULTIVITAMIN) TABS tablet Take 1 tablet by mouth See admin instructions. Pt takes two days on and one day off.   11/21/2015 at Unknown time    ROS:  Review of Systems  Constitutional: Negative for fever, chills and fatigue.  HENT: Negative for sinus pressure.   Eyes: Negative for photophobia.  Respiratory: Negative for shortness of breath.   Cardiovascular: Negative for chest pain.  Gastrointestinal: Negative for nausea, vomiting, diarrhea and constipation.  Genitourinary: Negative for dysuria, frequency, flank pain, vaginal bleeding, vaginal discharge, difficulty urinating, vaginal pain and pelvic pain.  Musculoskeletal: Negative for neck pain.  Neurological: Negative for dizziness,  weakness and headaches.  Psychiatric/Behavioral: Negative.      I have reviewed patient's Past Medical Hx, Surgical Hx, Family Hx, Social Hx, medications and allergies.   Physical Exam   Patient Vitals for the past 24 hrs:  BP Temp Temp src Pulse Resp SpO2 Height Weight  11/24/15 1923 136/83 mmHg 98 F (36.7 C) Oral 113 18 98 % 5\' 6"  (1.676 m) 246 lb (111.585 kg)   Constitutional: Well-developed, well-nourished female in no acute distress.  Cardiovascular: normal rate Respiratory: normal effort GI: Abd soft, non-tender, gravid appropriate for gestational age.  MS: Extremities nontender, no edema, normal ROM Neurologic: Alert and oriented x 4.  GU: Neg CVAT.  PELVIC EXAM: Cervix pink,  visually closed, without lesion, scant white creamy discharge, vaginal walls and external genitalia normal Bimanual exam: Cervix 0/long/high, firm, anterior, neg CMT, uterus nontender, nonenlarged, adnexa without tenderness, enlargement, or mass  Dilation: Fingertip Exam by:: L. Courtney ParisLeftwich Kirby CNM  FHT:  Baseline 135 , moderate variability, accelerations present, no decelerations Contractions: None on toco or to palpation   Labs: Results for orders placed or performed during the hospital encounter of 11/24/15 (from the past 24 hour(s))  Urinalysis, Routine w reflex microscopic ([facility])     Status: Abnormal   Collection Time: 11/24/15  7:25 PM  Result Value Ref Range   Color, Urine YELLOW YELLOW   APPearance CLEAR CLEAR   Specific Gravity, Urine >1.030 (H) 1.005 - 1.030   pH 5.5 5.0 - 8.0   Glucose, UA NEGATIVE NEGATIVE mg/dL   Hgb urine dipstick NEGATIVE NEGATIVE   Bilirubin Urine NEGATIVE NEGATIVE   Ketones, ur 40 (A) NEGATIVE mg/dL   Protein, ur NEGATIVE NEGATIVE mg/dL   Nitrite NEGATIVE NEGATIVE   Leukocytes, UA SMALL (A) NEGATIVE  Urine microscopic-add on     Status: Abnormal   Collection Time: 11/24/15  7:25 PM  Result Value Ref Range   Squamous Epithelial / LPF 0-5 (A) NONE SEEN   WBC, UA 0-5 0 - 5 WBC/hpf   RBC / HPF NONE SEEN 0 - 5 RBC/hpf   Bacteria, UA MANY (A) NONE SEEN   Urine-Other MUCOUS PRESENT       Imaging:  No results found.  MAU Course/MDM: I have ordered labs and reviewed results.  Consult Dr Chestine Sporelark.  Reassurance provided to pt that no signs of active labor tonight.  Labor precautions given.  Pt stable at time of discharge.  Assessment: 1. Threatened preterm labor, third trimester     Plan: Discharge home Labor precautions and fetal kick counts F/U with Dr Chestine Sporelark as scheduled Return to MAu as needed for emergencies    Medication List    ASK your doctor about these medications        acetaminophen 325 MG tablet  Commonly known as:   TYLENOL  Take 650 mg by mouth every 6 (six) hours as needed for mild pain or headache.     ondansetron 4 MG disintegrating tablet  Commonly known as:  ZOFRAN-ODT  Take 4 mg by mouth every 8 (eight) hours as needed for nausea or vomiting.     prenatal multivitamin Tabs tablet  Take 1 tablet by mouth See admin instructions. Pt takes two days on and one day off.        Sharen CounterLisa Leftwich-Kirby Certified Nurse-Midwife 11/24/2015 8:57 PM

## 2015-11-24 NOTE — MAU Note (Signed)
Pt reports contractions, states she was seen here 2 days ago for same. States contractions are 10 minutes apart and more intense. Denies bleeding or ROM

## 2015-11-25 ENCOUNTER — Other Ambulatory Visit: Payer: Self-pay | Admitting: Obstetrics and Gynecology

## 2015-11-27 LAB — OB RESULTS CONSOLE GBS: STREP GROUP B AG: NEGATIVE

## 2015-12-08 ENCOUNTER — Inpatient Hospital Stay (HOSPITAL_COMMUNITY)
Admission: AD | Admit: 2015-12-08 | Discharge: 2015-12-09 | Disposition: A | Discharge status: Home / Self Care | Payer: 59 | Source: Ambulatory Visit | Attending: Obstetrics and Gynecology | Admitting: Obstetrics and Gynecology

## 2015-12-08 NOTE — MAU Note (Signed)
Pt presents with complaint of contractions q 5 minutes for the last 12 hours. Denies bleeding or ROM.

## 2015-12-09 ENCOUNTER — Encounter (HOSPITAL_COMMUNITY): Payer: Self-pay | Admitting: *Deleted

## 2015-12-09 MED ORDER — ZOLPIDEM TARTRATE 5 MG PO TABS: 5.0000 mg | ORAL_TABLET | Freq: Once | ORAL | Status: DC

## 2015-12-09 NOTE — Discharge Instructions (Signed)
Fetal Movement Counts Patient Name: __________________________________________________ Patient Due Date: ____________________ Performing a fetal movement count is highly recommended in high-risk pregnancies, but it is good for every pregnant woman to do. Your health care provider may ask you to start counting fetal movements at 28 weeks of the pregnancy. Fetal movements often increase:  After eating a full meal.  After physical activity.  After eating or drinking something sweet or cold.  At rest. Pay attention to when you feel the baby is most active. This will help you notice a pattern of your baby's sleep and wake cycles and what factors contribute to an increase in fetal movement. It is important to perform a fetal movement count at the same time each day when your baby is normally most active.  HOW TO COUNT FETAL MOVEMENTS 1. Find a quiet and comfortable area to sit or lie down on your left side. Lying on your left side provides the best blood and oxygen circulation to your baby. 2. Write down the day and time on a sheet of paper or in a journal. 3. Start counting kicks, flutters, swishes, rolls, or jabs in a 2-hour period. You should feel at least 10 movements within 2 hours. 4. If you do not feel 10 movements in 2 hours, wait 2-3 hours and count again. Look for a change in the pattern or not enough counts in 2 hours. SEEK MEDICAL CARE IF:  You feel less than 10 counts in 2 hours, tried twice.  There is no movement in over an hour.  The pattern is changing or taking longer each day to reach 10 counts in 2 hours.  You feel the baby is not moving as he or she usually does. Date: ____________ Movements: ____________ Start time: ____________ Finish time: ____________  Date: ____________ Movements: ____________ Start time: ____________ Finish time: ____________ Date: ____________ Movements: ____________ Start time: ____________ Finish time: ____________ Date: ____________ Movements:  ____________ Start time: ____________ Finish time: ____________ Date: ____________ Movements: ____________ Start time: ____________ Finish time: ____________ Date: ____________ Movements: ____________ Start time: ____________ Finish time: ____________ Date: ____________ Movements: ____________ Start time: ____________ Finish time: ____________ Date: ____________ Movements: ____________ Start time: ____________ Finish time: ____________  Date: ____________ Movements: ____________ Start time: ____________ Finish time: ____________ Date: ____________ Movements: ____________ Start time: ____________ Finish time: ____________ Date: ____________ Movements: ____________ Start time: ____________ Finish time: ____________ Date: ____________ Movements: ____________ Start time: ____________ Finish time: ____________ Date: ____________ Movements: ____________ Start time: ____________ Finish time: ____________ Date: ____________ Movements: ____________ Start time: ____________ Finish time: ____________ Date: ____________ Movements: ____________ Start time: ____________ Finish time: ____________  Date: ____________ Movements: ____________ Start time: ____________ Finish time: ____________ Date: ____________ Movements: ____________ Start time: ____________ Finish time: ____________ Date: ____________ Movements: ____________ Start time: ____________ Finish time: ____________ Date: ____________ Movements: ____________ Start time: ____________ Finish time: ____________ Date: ____________ Movements: ____________ Start time: ____________ Finish time: ____________ Date: ____________ Movements: ____________ Start time: ____________ Finish time: ____________ Date: ____________ Movements: ____________ Start time: ____________ Finish time: ____________  Date: ____________ Movements: ____________ Start time: ____________ Finish time: ____________ Date: ____________ Movements: ____________ Start time: ____________ Finish  time: ____________ Date: ____________ Movements: ____________ Start time: ____________ Finish time: ____________ Date: ____________ Movements: ____________ Start time: ____________ Finish time: ____________ Date: ____________ Movements: ____________ Start time: ____________ Finish time: ____________ Date: ____________ Movements: ____________ Start time: ____________ Finish time: ____________ Date: ____________ Movements: ____________ Start time: ____________ Finish time: ____________  Date: ____________ Movements: ____________ Start time: ____________ Finish   time: ____________ Date: ____________ Movements: ____________ Start time: ____________ Finish time: ____________ Date: ____________ Movements: ____________ Start time: ____________ Finish time: ____________ Date: ____________ Movements: ____________ Start time: ____________ Finish time: ____________ Date: ____________ Movements: ____________ Start time: ____________ Finish time: ____________ Date: ____________ Movements: ____________ Start time: ____________ Finish time: ____________ Date: ____________ Movements: ____________ Start time: ____________ Finish time: ____________  Date: ____________ Movements: ____________ Start time: ____________ Finish time: ____________ Date: ____________ Movements: ____________ Start time: ____________ Finish time: ____________ Date: ____________ Movements: ____________ Start time: ____________ Finish time: ____________ Date: ____________ Movements: ____________ Start time: ____________ Finish time: ____________ Date: ____________ Movements: ____________ Start time: ____________ Finish time: ____________ Date: ____________ Movements: ____________ Start time: ____________ Finish time: ____________ Date: ____________ Movements: ____________ Start time: ____________ Finish time: ____________  Date: ____________ Movements: ____________ Start time: ____________ Finish time: ____________ Date: ____________  Movements: ____________ Start time: ____________ Finish time: ____________ Date: ____________ Movements: ____________ Start time: ____________ Finish time: ____________ Date: ____________ Movements: ____________ Start time: ____________ Finish time: ____________ Date: ____________ Movements: ____________ Start time: ____________ Finish time: ____________ Date: ____________ Movements: ____________ Start time: ____________ Finish time: ____________ Date: ____________ Movements: ____________ Start time: ____________ Finish time: ____________  Date: ____________ Movements: ____________ Start time: ____________ Finish time: ____________ Date: ____________ Movements: ____________ Start time: ____________ Finish time: ____________ Date: ____________ Movements: ____________ Start time: ____________ Finish time: ____________ Date: ____________ Movements: ____________ Start time: ____________ Finish time: ____________ Date: ____________ Movements: ____________ Start time: ____________ Finish time: ____________ Date: ____________ Movements: ____________ Start time: ____________ Finish time: ____________   This information is not intended to replace advice given to you by your health care provider. Make sure you discuss any questions you have with your health care provider.   Document Released: 01/03/2007 Document Revised: 12/25/2014 Document Reviewed: 09/30/2012 Elsevier Interactive Patient Education 2016 Else[employer] Inc[employer] Braxton Hicks Contractions Contractions of the uterus can occur throughout pregnancy. Contractions are not always a sign that you are in labor.  WHAT ARE BRAXTON HICKS CONTRACTIONS?  Contractions that occur before labor are called Braxton Hicks contractions, or false labor. Toward the end of pregnancy (32-34 weeks), these contractions can develop more often and may become more forceful. This is not true labor because these contractions do not result in opening (dilatation) and thinning of  the cervix. They are sometimes difficult to tell apart from true labor because these contractions can be forceful and people have different pain tolerances. You should not feel embarrassed if you go to the hospital with false labor. Sometimes, the only way to tell if you are in true labor is for your health care provider to look for changes in the cervix. If there are no prenatal problems or other health problems associated with the pregnancy, it is completely safe to be sent home with false labor and await the onset of true labor. HOW CAN YOU TELL THE DIFFERENCE BETWEEN TRUE AND FALSE LABOR? False Labor  The contractions of false labor are usually shorter and not as hard as those of true labor.   The contractions are usually irregular.   The contractions are often felt in the front of the lower abdomen and in the groin.   The contractions may go away when you walk around or change positions while lying down.   The contractions get weaker and are shorter lasting as time goes on.   The contractions do not usually become progressively stronger, regular, and closer together as with true labor.  True Labor 5. Contractions in true   labor last 30-70 seconds, become very regular, usually become more intense, and increase in frequency.  6. The contractions do not go away with walking.  7. The discomfort is usually felt in the top of the uterus and spreads to the lower abdomen and low back.  8. True labor can be determined by your health care provider with an exam. This will show that the cervix is dilating and getting thinner.  WHAT TO REMEMBER  Keep up with your usual exercises and follow other instructions given by your health care provider.   Take medicines as directed by your health care provider.   Keep your regular prenatal appointments.   Eat and drink lightly if you think you are going into labor.   If Braxton Hicks contractions are making you uncomfortable:   Change  your position from lying down or resting to walking, or from walking to resting.   Sit and rest in a tub of warm water.   Drink 2-3 glasses of water. Dehydration may cause these contractions.   Do slow and deep breathing several times an hour.  WHEN SHOULD I SEEK IMMEDIATE MEDICAL CARE? Seek immediate medical care if:  Your contractions become stronger, more regular, and closer together.   You have fluid leaking or gushing from your vagina.   You have a fever.   You pass blood-tinged mucus.   You have vaginal bleeding.   You have continuous abdominal pain.   You have low back pain that you never had before.   You feel your baby's head pushing down and causing pelvic pressure.   Your baby is not moving as much as it used to.    This information is not intended to replace advice given to you by your health care provider. Make sure you discuss any questions you have with your health care provider.   Document Released: 12/04/2005 Document Revised: 12/09/2013 Document Reviewed: 09/15/2013 Elsevier Interactive Patient Education 2016 Elsevier Inc.  

## 2015-12-11 ENCOUNTER — Inpatient Hospital Stay (HOSPITAL_COMMUNITY): Payer: 59 | Admitting: Anesthesiology

## 2015-12-11 ENCOUNTER — Encounter (HOSPITAL_COMMUNITY): Payer: Self-pay | Admitting: *Deleted

## 2015-12-11 ENCOUNTER — Inpatient Hospital Stay (HOSPITAL_COMMUNITY)
Admission: AD | Admit: 2015-12-11 | Discharge: 2015-12-12 | DRG: 775 | Disposition: A | Discharge status: Home / Self Care | Payer: 59 | Source: Ambulatory Visit | Attending: Obstetrics and Gynecology | Admitting: Obstetrics and Gynecology

## 2015-12-11 DIAGNOSIS — O9962 Diseases of the digestive system complicating childbirth: Secondary | ICD-10-CM | POA: Diagnosis present

## 2015-12-11 DIAGNOSIS — Z3A38 38 weeks gestation of pregnancy: Secondary | ICD-10-CM

## 2015-12-11 DIAGNOSIS — K219 Gastro-esophageal reflux disease without esophagitis: Secondary | ICD-10-CM | POA: Diagnosis present

## 2015-12-11 DIAGNOSIS — O4202 Full-term premature rupture of membranes, onset of labor within 24 hours of rupture: Secondary | ICD-10-CM

## 2015-12-11 DIAGNOSIS — O4290 Premature rupture of membranes, unspecified as to length of time between rupture and onset of labor, unspecified weeks of gestation: Secondary | ICD-10-CM | POA: Diagnosis present

## 2015-12-11 LAB — CBC
HEMATOCRIT: 33.8 % — AB (ref 36.0–46.0)
HEMOGLOBIN: 11.2 g/dL — AB (ref 12.0–15.0)
MCH: 28.5 pg (ref 26.0–34.0)
MCHC: 33.1 g/dL (ref 30.0–36.0)
MCV: 86 fL (ref 78.0–100.0)
Platelets: 231 10*3/uL (ref 150–400)
RBC: 3.93 MIL/uL (ref 3.87–5.11)
RDW: 13.7 % (ref 11.5–15.5)
WBC: 8.5 10*3/uL (ref 4.0–10.5)

## 2015-12-11 LAB — OB RESULTS CONSOLE HIV ANTIBODY (ROUTINE TESTING): HIV: NONREACTIVE

## 2015-12-11 LAB — ABO/RH: ABO/RH(D): A POS

## 2015-12-11 LAB — OB RESULTS CONSOLE GBS: STREP GROUP B AG: NEGATIVE

## 2015-12-11 LAB — TYPE AND SCREEN
ABO/RH(D): A POS
ANTIBODY SCREEN: NEGATIVE

## 2015-12-11 LAB — POCT FERN TEST: POCT FERN TEST: POSITIVE

## 2015-12-11 LAB — OB RESULTS CONSOLE RPR: RPR: NONREACTIVE

## 2015-12-11 LAB — OB RESULTS CONSOLE RUBELLA ANTIBODY, IGM: RUBELLA: IMMUNE

## 2015-12-11 LAB — OB RESULTS CONSOLE HEPATITIS B SURFACE ANTIGEN: HEP B S AG: NEGATIVE

## 2015-12-11 MED ORDER — LIDOCAINE HCL (PF) 1 % IJ SOLN
INTRAMUSCULAR | Status: DC | PRN
  Administered 2015-12-11: 2 mL via EPIDURAL
  Administered 2015-12-11: 5 mL via EPIDURAL
  Administered 2015-12-11: 3 mL via EPIDURAL

## 2015-12-11 MED ORDER — ONDANSETRON HCL 4 MG PO TABS
4.0000 mg | ORAL_TABLET | ORAL | Status: DC | PRN
Start: 2015-12-11 — End: 2015-12-12

## 2015-12-11 MED ORDER — PHENYLEPHRINE 40 MCG/ML (10ML) SYRINGE FOR IV PUSH (FOR BLOOD PRESSURE SUPPORT)
80.0000 ug | PREFILLED_SYRINGE | INTRAVENOUS | Status: AC | PRN
  Administered 2015-12-11 (×3): 80 ug via INTRAVENOUS
  Filled 2015-12-11: qty 20

## 2015-12-11 MED ORDER — LANOLIN HYDROUS EX OINT: TOPICAL_OINTMENT | CUTANEOUS | Status: DC | PRN

## 2015-12-11 MED ORDER — DIPHENHYDRAMINE HCL 50 MG/ML IJ SOLN: 12.5000 mg | INTRAMUSCULAR | Status: DC | PRN

## 2015-12-11 MED ORDER — ONDANSETRON HCL 4 MG/2ML IJ SOLN: 4.0000 mg | INTRAMUSCULAR | Status: DC | PRN

## 2015-12-11 MED ORDER — PHENYLEPHRINE 40 MCG/ML (10ML) SYRINGE FOR IV PUSH (FOR BLOOD PRESSURE SUPPORT)
80.0000 ug | PREFILLED_SYRINGE | INTRAVENOUS | Status: DC | PRN
  Filled 2015-12-11: qty 2

## 2015-12-11 MED ORDER — OXYCODONE-ACETAMINOPHEN 5-325 MG PO TABS: 1.0000 | ORAL_TABLET | ORAL | Status: DC | PRN

## 2015-12-11 MED ORDER — EPHEDRINE 5 MG/ML INJ
10.0000 mg | INTRAVENOUS | Status: DC | PRN
  Filled 2015-12-11: qty 2
  Filled 2015-12-11: qty 4

## 2015-12-11 MED ORDER — LACTATED RINGERS IV SOLN
500.0000 mL | INTRAVENOUS | Status: DC | PRN
  Administered 2015-12-11 (×2): 500 mL via INTRAVENOUS
  Administered 2015-12-11: 250 mL via INTRAVENOUS

## 2015-12-11 MED ORDER — FENTANYL 2.5 MCG/ML BUPIVACAINE 1/10 % EPIDURAL INFUSION (WH - ANES)
14.0000 mL/h | INTRAMUSCULAR | Status: DC | PRN
  Administered 2015-12-11: 14 mL/h via EPIDURAL
  Filled 2015-12-11: qty 125

## 2015-12-11 MED ORDER — TETANUS-DIPHTH-ACELL PERTUSSIS 5-2.5-18.5 LF-MCG/0.5 IM SUSP: 0.5000 mL | Freq: Once | INTRAMUSCULAR | Status: DC

## 2015-12-11 MED ORDER — SIMETHICONE 80 MG PO CHEW: 80.0000 mg | CHEWABLE_TABLET | ORAL | Status: DC | PRN

## 2015-12-11 MED ORDER — BUTORPHANOL TARTRATE 1 MG/ML IJ SOLN: 1.0000 mg | INTRAMUSCULAR | Status: DC | PRN

## 2015-12-11 MED ORDER — DIPHENHYDRAMINE HCL 25 MG PO CAPS: 25.0000 mg | ORAL_CAPSULE | Freq: Four times a day (QID) | ORAL | Status: DC | PRN

## 2015-12-11 MED ORDER — DIBUCAINE 1 % RE OINT: 1.0000 "application " | TOPICAL_OINTMENT | RECTAL | Status: DC | PRN

## 2015-12-11 MED ORDER — OXYCODONE-ACETAMINOPHEN 5-325 MG PO TABS: 2.0000 | ORAL_TABLET | ORAL | Status: DC | PRN

## 2015-12-11 MED ORDER — CITRIC ACID-SODIUM CITRATE 334-500 MG/5ML PO SOLN: 30.0000 mL | ORAL | Status: DC | PRN

## 2015-12-11 MED ORDER — TERBUTALINE SULFATE 1 MG/ML IJ SOLN
0.2500 mg | Freq: Once | INTRAMUSCULAR | Status: DC | PRN
  Filled 2015-12-11: qty 1

## 2015-12-11 MED ORDER — WITCH HAZEL-GLYCERIN EX PADS: 1.0000 "application " | MEDICATED_PAD | CUTANEOUS | Status: DC | PRN

## 2015-12-11 MED ORDER — LACTATED RINGERS IV SOLN
INTRAVENOUS | Status: DC
Start: 2015-12-11 — End: 2015-12-11
  Administered 2015-12-11: 11:00:00 via INTRAVENOUS

## 2015-12-11 MED ORDER — ACETAMINOPHEN 325 MG PO TABS: 650.0000 mg | ORAL_TABLET | ORAL | Status: DC | PRN

## 2015-12-11 MED ORDER — SENNOSIDES-DOCUSATE SODIUM 8.6-50 MG PO TABS
2.0000 | ORAL_TABLET | ORAL | Status: DC
  Administered 2015-12-11: 2 via ORAL
  Filled 2015-12-11 (×2): qty 2

## 2015-12-11 MED ORDER — METHYLERGONOVINE MALEATE 0.2 MG PO TABS: 0.2000 mg | ORAL_TABLET | ORAL | Status: DC | PRN

## 2015-12-11 MED ORDER — BENZOCAINE-MENTHOL 20-0.5 % EX AERO
1.0000 "application " | INHALATION_SPRAY | CUTANEOUS | Status: DC | PRN
  Administered 2015-12-11: 1 via TOPICAL
  Filled 2015-12-11: qty 56

## 2015-12-11 MED ORDER — OXYTOCIN BOLUS FROM INFUSION
500.0000 mL | INTRAVENOUS | Status: DC
  Administered 2015-12-11: 500 mL via INTRAVENOUS

## 2015-12-11 MED ORDER — OXYCODONE-ACETAMINOPHEN 5-325 MG PO TABS
1.0000 | ORAL_TABLET | ORAL | Status: DC | PRN
  Administered 2015-12-11 – 2015-12-12 (×4): 1 via ORAL
  Filled 2015-12-11 (×3): qty 1

## 2015-12-11 MED ORDER — ACETAMINOPHEN 325 MG PO TABS
650.0000 mg | ORAL_TABLET | ORAL | Status: DC | PRN
  Administered 2015-12-11: 650 mg via ORAL
  Filled 2015-12-11: qty 2

## 2015-12-11 MED ORDER — ONDANSETRON HCL 4 MG/2ML IJ SOLN: 4.0000 mg | Freq: Four times a day (QID) | INTRAMUSCULAR | Status: DC | PRN

## 2015-12-11 MED ORDER — OXYTOCIN 40 UNITS IN LACTATED RINGERS INFUSION - SIMPLE MED: 62.5000 mL/h | INTRAVENOUS | Status: DC

## 2015-12-11 MED ORDER — ZOLPIDEM TARTRATE 5 MG PO TABS: 5.0000 mg | ORAL_TABLET | Freq: Every evening | ORAL | Status: DC | PRN

## 2015-12-11 MED ORDER — METHYLERGONOVINE MALEATE 0.2 MG/ML IJ SOLN: 0.2000 mg | INTRAMUSCULAR | Status: DC | PRN

## 2015-12-11 MED ORDER — LIDOCAINE HCL (PF) 1 % IJ SOLN
30.0000 mL | INTRAMUSCULAR | Status: DC | PRN
  Filled 2015-12-11: qty 30

## 2015-12-11 MED ORDER — OXYTOCIN 40 UNITS IN LACTATED RINGERS INFUSION - SIMPLE MED
1.0000 m[IU]/min | INTRAVENOUS | Status: DC
  Administered 2015-12-11: 2 m[IU]/min via INTRAVENOUS
  Filled 2015-12-11: qty 1000

## 2015-12-11 MED ORDER — OXYCODONE-ACETAMINOPHEN 5-325 MG PO TABS
2.0000 | ORAL_TABLET | ORAL | Status: DC | PRN
  Filled 2015-12-11: qty 2

## 2015-12-11 MED ORDER — PRENATAL MULTIVITAMIN CH
1.0000 | ORAL_TABLET | Freq: Every day | ORAL | Status: DC
  Administered 2015-12-12: 1 via ORAL
  Filled 2015-12-11: qty 1

## 2015-12-11 NOTE — Lactation Note (Signed)
This note was copied from the chart of Boy Elmore GuiseHeather Lawhead. Lactation Consultation Note  Patient Name: Boy Elmore GuiseHeather Kreps JYNWG'NToday's Date: 12/11/2015 Reason for consult: Initial assessment Baby at 5 hr of life and mom reports bf is going well. Mom only pumped for her 14 yr and 12 yr. The older daughter was born at 2736 wk and never latched, the middle daughter struggled with latch but mom had to go back to work full time at 6 wk PP. Mom was able to make enough milk pumping q3 hr for 8 wk to have a freezer stash for each girl for 10 months. She was asking about renting a [employer] [place] because that is the only pump that worked well for her. She is considering pump to feed again but is more open to latching since baby is doing so well. [facility]. Answered questions about maternal diet while bf. Discussed baby behavior, feeding frequency, baby belly size, voids, wt loss, breast changes, and nipple care. Mom states that she knows how to manually express and has seen colostrum bilaterally. Given lactation handouts. Aware of OP services and support group.       Maternal Data Has patient been taught Hand Expression?: Yes Does the patient have breastfeeding experience prior to this delivery?: Yes  Feeding Feeding Type: Breast Fed Length of feed: 20 min  LATCH Score/Interventions Latch: Repeated attempts needed to sustain latch, nipple held in mouth throughout feeding, stimulation needed to elicit sucking reflex. Intervention(s): Adjust position;Assist with latch;Breast massage;Breast compression  Audible Swallowing: None Intervention(s): Skin to skin;Hand expression  Type of Nipple: Flat Intervention(s): Reverse pressure  Comfort (Breast/Nipple): Soft / non-tender     Hold (Positioning): Assistance needed to correctly position infant at breast and maintain latch. Intervention(s): Breastfeeding basics reviewed;Support Pillows;Skin to  skin  LATCH Score: 5  Lactation Tools Discussed/Used WIC Program: Yes   Consult Status Consult Status: Follow-up Date: 12/12/15 Follow-up type: In-patient    Rulon Eisenmengerlizabeth E Smriti Barkow 12/11/2015, 9:44 PM

## 2015-12-11 NOTE — Anesthesia Preprocedure Evaluation (Addendum)
Anesthesia Evaluation  Patient identified by MRN, date of birth, ID band Patient awake    Reviewed: Allergy & Precautions, NPO status , Patient's Chart, lab work & pertinent test results  History of Anesthesia Complications Negative for: history of anesthetic complications  Airway Mallampati: II  TM Distance: >3 FB Neck ROM: Full    Dental  (+) Teeth Intact, Dental Advisory Given   Pulmonary neg pulmonary ROS,    Pulmonary exam normal breath sounds clear to auscultation       Cardiovascular Exercise Tolerance: Good (-) hypertensionnegative cardio ROS Normal cardiovascular exam Rhythm:Regular Rate:Normal     Neuro/Psych  Headaches, PSYCHIATRIC DISORDERS Depression    GI/Hepatic Neg liver ROS, GERD  ,  Endo/Other  negative endocrine ROS  Renal/GU negative Renal ROS     Musculoskeletal negative musculoskeletal ROS (+)   Abdominal   Peds  Hematology negative hematology ROS (+) Plt 231k   Anesthesia Other Findings Day of surgery medications reviewed with the patient.  Reproductive/Obstetrics (+) Pregnancy                            Anesthesia Physical Anesthesia Plan  ASA: III  Anesthesia Plan: Epidural   Post-op Pain Management:    Induction:   Airway Management Planned:   Additional Equipment:   Intra-op Plan:   Post-operative Plan:   Informed Consent: I have reviewed the patients History and Physical, chart, labs and discussed the procedure including the risks, benefits and alternatives for the proposed anesthesia with the patient or authorized representative who has indicated his/her understanding and acceptance.   Dental advisory given  Plan Discussed with:   Anesthesia Plan Comments: (Patient identified. Risks/Benefits/Options discussed with patient including but not limited to bleeding, infection, nerve damage, paralysis, failed block, incomplete pain control, headache,  blood pressure changes, nausea, vomiting, reactions to medication both or allergic, itching and postpartum back pain. Confirmed with bedside nurse the patient's most recent platelet count. Confirmed with patient that they are not currently taking any anticoagulation, have any bleeding history or any family history of bleeding disorders. Patient expressed understanding and wished to proceed. All questions were answered. )        Anesthesia Quick Evaluation

## 2015-12-11 NOTE — Anesthesia Procedure Notes (Signed)
Epidural Patient location during procedure: OB  Staffing Anesthesiologist: TURK, STEPHEN EDWARD Performed by: anesthesiologist   Preanesthetic Checklist Completed: patient identified, pre-op evaluation, timeout performed, IV checked, risks and benefits discussed and monitors and equipment checked  Epidural Patient position: sitting Prep: DuraPrep Patient monitoring: blood pressure and continuous pulse ox Approach: midline Location: L3-L4 Injection technique: LOR air  Needle:  Needle type: Tuohy  Needle gauge: 17 G Needle length: 9 cm Needle insertion depth: 7 cm Catheter size: 19 Gauge Catheter at skin depth: 12 cm Test dose: negative and Other (1% Lidocaine)  Additional Notes Patient identified.  Risk benefits discussed including failed block, incomplete pain control, headache, nerve damage, paralysis, blood pressure changes, nausea, vomiting, reactions to medication both toxic or allergic, and postpartum back pain.  Patient expressed understanding and wished to proceed.  All questions were answered.  Sterile technique used throughout procedure and epidural site dressed with sterile barrier dressing. No paresthesia or other complications noted. The patient did not experience any signs of intravascular injection such as tinnitus or metallic taste in mouth nor signs of intrathecal spread such as rapid motor block. Please see nursing notes for vital signs. Reason for block:procedure for pain   

## 2015-12-11 NOTE — MAU Note (Signed)
Pt complains of ROM around 0900 to a large gush of clear fluid.  Denies VB.  Irregular contractions.

## 2015-12-11 NOTE — H&P (Signed)
[name] is a 34 y.o. female presenting for leaking fluid  34 yo G3P1102 @ 38 weeks presents to MAU for leaking fluid and was confirmed to have spontaneously ruptured her membranes. She has had some mildly elevated blood pressures throughout her pregnancy. None ever required treatment.  History OB History    Gravida Para Term Preterm AB TAB SAB Ectopic Multiple Living   3 2 1 1      2      Past Medical History  Diagnosis Date  . Depression   . Migraines   . LGSIL (low grade squamous intraepithelial lesion) on Pap smear 08/2004  . GERD (gastroesophageal reflux disease)    Past Surgical History  Procedure Laterality Date  . Nasal sinus surgery    . Nissenfundo plication  2005   Family History: family history includes Cancer in her maternal grandmother; Diabetes in her maternal grandfather; Endometriosis in her sister; Hypertension in her mother. Social History:  reports that she has never smoked. She has never used smokeless tobacco. She reports that she does not drink alcohol or use illicit drugs.   Prenatal Transfer Tool  Maternal Diabetes: No Genetic Screening: Normal Maternal Ultrasounds/Referrals: Normal Fetal Ultrasounds or other Referrals:  None Maternal Substance Abuse:  No Significant Maternal Medications:  None Significant Maternal Lab Results:  None Other Comments:  None  ROS  Dilation: 3 Effacement (%): 70 Station: -3 Exam by:: A. Gagliardo, RN Blood pressure 122/78, pulse 98, temperature 97.8 F (36.6 C), temperature source Oral, resp. rate 18, last menstrual period 03/09/2015. Exam Physical Exam  Prenatal labs: ABO, Rh:  A positive Antibody:  Negative Rubella:  Immune RPR:   NR HBsAg:   Neg HIV:   NR GBS:   Neg  Assessment/Plan: 1) Admit 2) Pitocin augmentation 3) Epidural on request   Cherlyn Syring H. 12/11/2015, 10:21 AM

## 2015-12-12 ENCOUNTER — Encounter (HOSPITAL_COMMUNITY): Payer: Self-pay | Admitting: *Deleted

## 2015-12-12 LAB — CBC
HEMATOCRIT: 31.8 % — AB (ref 36.0–46.0)
HEMOGLOBIN: 10.2 g/dL — AB (ref 12.0–15.0)
MCH: 27.6 pg (ref 26.0–34.0)
MCHC: 32.1 g/dL (ref 30.0–36.0)
MCV: 86.2 fL (ref 78.0–100.0)
Platelets: 214 10*3/uL (ref 150–400)
RBC: 3.69 MIL/uL — ABNORMAL LOW (ref 3.87–5.11)
RDW: 13.8 % (ref 11.5–15.5)
WBC: 12.6 10*3/uL — AB (ref 4.0–10.5)

## 2015-12-12 LAB — RPR: RPR: NONREACTIVE

## 2015-12-12 MED ORDER — DOCUSATE SODIUM 100 MG PO CAPS: 100.0000 mg | ORAL_CAPSULE | Freq: Two times a day (BID) | ORAL | Status: DC

## 2015-12-12 MED ORDER — OXYCODONE-ACETAMINOPHEN 5-325 MG PO TABS: 1.0000 | ORAL_TABLET | ORAL | Status: DC | PRN

## 2015-12-12 NOTE — Discharge Summary (Signed)
Obstetric Discharge Summary Reason for Admission: onset of labor Prenatal Procedures: NST and ultrasound Intrapartum Procedures: spontaneous vaginal delivery Postpartum Procedures: none Complications-Operative and Postpartum: 1st degree perineal laceration HEMOGLOBIN  Date Value Ref Range Status  12/12/2015 10.2* 12.0 - 15.0 g/dL Final   HCT  Date Value Ref Range Status  12/12/2015 31.8* 36.0 - 46.0 % Final    Physical Exam:  General: alert, cooperative and appears stated age 35Lochia: appropriate Uterine Fundus: firm Incision: healing well DVT Evaluation: No evidence of DVT seen on physical exam.  Discharge Diagnoses: Term Pregnancy-delivered  Discharge Information: Date: 12/12/2015 Activity: pelvic rest Diet: routine Medications: Colace and Percocet Condition: improved Instructions: refer to practice specific booklet Discharge to: home Follow-up Information    Follow up with Almon HerculesOSS,Adger Cantera H., MD In 4 weeks.   Specialty:  Obstetrics and Gynecology   Why:  Follow up in 4-6 weeks for a postpartum evaluation   Contact information:   [address] [place] [id number] [phone]       Newborn Data: Live born female  Birth Weight: 7 lb 3 oz (3260 g) APGAR: 8, 9  Home with mother.  Braelin Costlow H. 12/12/2015, 12:45 PM

## 2015-12-12 NOTE — Lactation Note (Signed)
This note was copied from the chart of Lindsey Elmore GuiseHeather Crago. Lactation Consultation Note Mom called to verify latch. Noted #24 NS looked to large, fitted #20, d/t baby also didn't like the #24 NS. Latched better to #20 NS. Demonstrated to mom "C" hold. Reviewed several times positoning and hold for easier latching to prevent soreness. Mom became comfortable as well as baby latching well. Encouraged to look for residue in NS and listen for swallows. Encouraged to wear shells to assist Nipples to evert more. Reminded of OP LC, and if mom cont. To use NS needs to call for FU appt. W/LC. Mom states she thinks with all the tools she has it will help her nipple to evert more, but if needed she will call. Has f/u appt. On Tues.  Patient Name: Lindsey Elmore GuiseHeather Cu WUJWJ'XToday's Date: 12/12/2015 Reason for consult: Follow-up assessment;Difficult latch   Maternal Data    Feeding Feeding Type: Breast Fed Length of feed: 20 min  LATCH Score/Interventions Latch: Repeated attempts needed to sustain latch, nipple held in mouth throughout feeding, stimulation needed to elicit sucking reflex. Intervention(s): Adjust position;Assist with latch;Breast massage;Breast compression  Audible Swallowing: A few with stimulation Intervention(s): Skin to skin;Hand expression  Type of Nipple: Flat Intervention(s): Shells;Hand pump  Comfort (Breast/Nipple): Filling, red/small blisters or bruises, mild/mod discomfort  Problem noted: Mild/Moderate discomfort Interventions (Mild/moderate discomfort): Comfort gels;Breast shields;Post-pump;Hand massage;Hand expression;Pre-pump if needed  Hold (Positioning): Assistance needed to correctly position infant at breast and maintain latch. Intervention(s): Position options;Skin to skin;Support Pillows;Breastfeeding basics reviewed  LATCH Score: 5  Lactation Tools Discussed/Used Tools: Shells;Nipple Shields;Pump;Comfort gels Nipple shield size: 20 Shell Type: Inverted Breast pump  type: Manual Pump Review: Setup, frequency, and cleaning;Milk Storage Initiated by:: Peri JeffersonL. Toini Failla RN Date initiated:: 12/12/15   Consult Status Consult Status: Complete Date: 12/12/15 Follow-up type: In-patient    Charyl DancerCARVER, Myrtle Barnhard G 12/12/2015, 3:52 PM

## 2015-12-12 NOTE — Anesthesia Postprocedure Evaluation (Signed)
Anesthesia Post Note  Patient: [name]  Procedure(s) Performed: * No procedures listed *  Patient location during evaluation: Mother Baby Anesthesia Type: Epidural Level of consciousness: awake Pain management: satisfactory to patient Vital Signs Assessment: post-procedure vital signs reviewed and stable Respiratory status: spontaneous breathing Cardiovascular status: stable Anesthetic complications: no    Last Vitals:  Filed Vitals:   12/11/15 1948 12/12/15 0325  BP: 126/73 117/60  Pulse: 94 77  Temp: 36.8 C 36.8 C  Resp: 18 18    Last Pain:  Filed Vitals:   12/12/15 0750  PainSc: 5                  Sabreen Kitchen

## 2015-12-12 NOTE — Progress Notes (Signed)
Discharge papers and instructions discussed with patient. Denies questions at this time.

## 2015-12-12 NOTE — Lactation Note (Addendum)
This note was copied from the chart of [name]. Lactation Consultation Note Experienced BF mom, but has been 12 yrs since BF. Mom requested NS. Has large nipples, fitted #24 NS, nipple gets larger w/stimulation. Gave shells to evert nipples as well as hand pump to pre-pump to evert nipple before latching. Mom c/o soreness and tenderness to nipples. Noted some bruising. Comfort gels given. Encouraged mom to call for latch at next feeding. Mom has large pendulum breast. Encouraged to massage and hand express colostrum. Latches has been 5 & 6. Needs to be higher before d/c home late tonight. Baby has recessed chin.  Patient Name: [name] Gosline ZOXWR'UToday's Date: 12/12/2015 Reason for consult: Follow-up assessment   Maternal Data    Feeding    LATCH Score/Interventions       Type of Nipple: Flat Intervention(s): Shells;Hand pump  Comfort (Breast/Nipple): Filling, red/small blisters or bruises, mild/mod discomfort  Problem noted: Mild/Moderate discomfort Interventions (Mild/moderate discomfort): Comfort gels;Breast shields;Pre-pump if needed;Hand expression;Hand massage  Intervention(s): Skin to skin;Position options;Support Pillows;Breastfeeding basics reviewed     Lactation Tools Discussed/Used Tools: Shells;Nipple Shields;Pump;Comfort gels Nipple shield size: 24 Shell Type: Inverted Breast pump type: Manual Pump Review: Setup, frequency, and cleaning;Milk Storage Initiated by:: Peri JeffersonL. Mili Piltz RN Date initiated:: 12/12/15   Consult Status Consult Status: Follow-up Date: 12/12/15 Follow-up type: In-patient    Charyl DancerCARVER, Babe Clenney G 12/12/2015, 12:32 PM

## 2015-12-13 ENCOUNTER — Encounter (HOSPITAL_COMMUNITY): Payer: Self-pay | Admitting: *Deleted

## 2015-12-14 ENCOUNTER — Ambulatory Visit (HOSPITAL_COMMUNITY)
Admission: RE | Admit: 2015-12-14 | Discharge: 2015-12-14 | Disposition: A | Discharge status: Home / Self Care | Payer: 59 | Source: Ambulatory Visit | Attending: Obstetrics and Gynecology | Admitting: Obstetrics and Gynecology

## 2015-12-14 NOTE — Lactation Note (Signed)
Lactation Consult  Mother's reason for visit:  Feeding assessment. Mom was walk-in reporting she thought she had an appointment today. Peds appointment at 11:00.  Visit Type:  Outpatient Appointment Notes:  Mom c/o of sore, cracked nipples with BF. She was d/c'd using a nipple shield but not using with every feeding reporting baby is latching. Mom would like help with latch before she gives up on breastfeeding. She pump/bottle fed her older children due to pain with nursing. Consult:  Initial Lactation Consultant:  [name], [name]  ________________________________________________________________________   Baby's Name: [name] Date of Birth: 12/11/2015 Pediatric[facility] Gender: female Gestational Age: 2275w0d (At Birth) Birth Weight: 7 lb 3 oz (3260 g) Weight at Discharge: Weight: 7 lb 1.9 oz (3230 g)Date of Discharge: 12/12/2015 Filed Weights   12/11/15 1644 12/12/15 0000  Weight: 7 lb 3 oz (3260 g) 7 lb 1.9 oz (3230 g)   [facility]  Weight today: 2990 gm/6 lb. 9.5 oz      ________________________________________________________________________  Mother's Name: [name] Type of delivery:  SVB Breastfeeding Experience:  P3 - Pump/bottle Maternal Medical Conditions:  None reported Maternal Medications:    ________________________________________________________________________  Breastfeeding History (Post Discharge)  Frequency of breastfeeding:  Every 2-3 hours, 14 times at breast on 10/13/15, 6 times at breast since midnight today Duration of feeding:  10-30 minutes  Patient does not supplement or pump.  Infant Intake and Output Assessment  Voids:  2-3 in 24 hrs.  Color:  Clear yellow Stools:  3 in 24 hrs.  Color:  Green  ________________________________________________________________________  Maternal Breast Assessment  Breast:   Soft Nipple:  Flat Pain level:  4-5 Pain interventions:  Cream/oil  _______________________________________________________________________ Feeding Assessment/Evaluation  Initial feeding assessment:  Infant's oral assessment:  Variance. Thick, short labial frenulum, short posterior lingual frenulum  Positioning:  Cross cradle Right breast  LATCH documentation:  Latch:  2 = Grasps breast easily, tongue down, lips flanged, rhythmical sucking. Using 24 nipple shield, took few attempts for baby to obtain good depth.   Audible swallowing:  1 = A few with stimulation. Some colostrum observed in nipple shield with feeding.  Type of nipple:  1 = Flat  Comfort (Breast/Nipple):  0 = Engorged, cracked, bleeding, large blisters, severe discomfort. Mom has positional stripe on left nipple, slight bruising on right. Superficial cracking on left nipple as well. PS 2 today with initial improving to 0 with nursing using a 24 nipple shield.   Hold (Positioning):  1 = Assistance needed to correctly position infant at breast and maintain latch  LATCH score:  5  Attached assessment:  Shallow initially but with nursing baby was able to obtain good depth with 24 nipple shield  Lips flanged:  Yes.    Lips untucked:  Yes.    Suck assessment:  Nutritive  Tools:  Nipple shield 24 mm Instructed on use and cleaning of tool:  Yes.    Pre-feed weight:  2990 g  (6 lb. 9.5 oz.) Post-feed weight:  3006 g (6 lb. 10.1 oz.) Amount transferred:  16 ml  With nursing on right breast for 20 minutes. Amount supplemented:  Mom had to leave for Peds appointment. [facility]   Total amount transferred:  16 ml  Mom was not scheduled today for a visit as she thought so this visit was short. LC did assist Mom with latching baby using nipple shield.  Encouraged Mom to use nipple shield due to nipple pain/trauma. Baby did transfer some colostrum and once baby obtained good depth, demonstrated some good  suckling bursts with well flanged lips. Mom reported much less pain at this visit. Encouraged Mom to BF with feeding ques, but at least 8-12 times in 24 hours. Keep baby nursing for 15-20 minutes, both breasts when possible. Pre-pump as needed to help with latch. Use #24 nipple shield with latch and look for breast milk in the nipple shield. Reviewed how to apply correctly. Hand out given regarding care of nipple shield.  Mom to post pump for 15 minutes and give baby back any amount of EBM she receives. Mom to call insurance about a pump today. Mom has used Medela PNS DEBP and LC checked pressures at this visit and motor pumping with adequate pressure, LC advised to be sure to clean pump well since it is used and needs new bottle, tubing, flanges.  Parents report they want to have new pump. If not able to get pump from insurance company will return for 2 week rental. OP f/u with lactation scheduled for Monday, 12/20/15 at 10:30. Call for questions/concerns. Mom to BF at [facility] to complete a feeding.  Continue using nipple butter for sore nipples.

## 2015-12-20 ENCOUNTER — Ambulatory Visit (HOSPITAL_COMMUNITY)
Admission: RE | Admit: 2015-12-20 | Discharge: 2015-12-20 | Disposition: A | Discharge status: Home / Self Care | Payer: 59 | Source: Ambulatory Visit | Attending: Obstetrics and Gynecology | Admitting: Obstetrics and Gynecology

## 2015-12-20 NOTE — Lactation Note (Addendum)
Lactation Consult  Mother's reason for visit:  Sore nipples, nipple shield use Visit Type:  Feeding assessment Appointment Notes:  See below Consult:  Follow-Up Lactation Consultant:  Judee ClaraSmith, Lula Kolton E Baby's Name:[name] Date of Birth: [birth year] Pediatrician- [facility] SalmonJanet Dees Gestational Age:35 weeks Birth Weight: 7 lbs 3 oz Weight at Discharge:7 lbs 1.9 oz Date of Discharge: 12/12/15   Location: Lactation Department Outpatient appointment on 12/14/15 - 6 lbs. 9.5 oz Weight today: 7 lbs 2.3 oz ________________________________________________________________________  [name] comes in today for follow up visit after a short OP visit on 12.27.16.  (6 days prior).  Baby has gained 8.8 oz in 6 days.  Baby looks jaundiced, but parents both say it is improving, and baby is active/gaining weight/and great output of >8-10 stools per day.  [name] is stating the latch is more comfortable, with only slight pain on initial latching.  Observed [place] positioning and latching baby using a cradle hold. Skin to skin benefits talked about to help keep baby alert and feeding actively on the breast.  Discussed benefits of using cross cradle or football hold, where she can support her breast and support baby's head.  Rhilee switched to this position, and used alternating breast compression during the feeding to stimulate baby to suck/swallow more frequently.  Baby has slight recess in jaw, and encouraged Velia to tip baby's head where chin and cheeks are in closer to breast.  Lips flanged and latch wide.  Basic teaching reviewed with [name] while baby feeding.   Baby had fed an hour prior to appointment, on one breast, but baby transferred 44 ml at this feeding.  Nipples both looked everted and rounded following feeding.  Baby acting very contented.  Encouraged skin to skin during feedings to increase affective milk transfer at the breast, and continue to feed on cue.   [name] will continue 2 X a day double pumping to preserve her milk supply, and have excess to feed baby occasionally by bottle (her choice).  She has already started some work from home, but will keep it to a minimum for a few weeks.     [name] knows to call prn for any assistance.  Follow-up with Pediatrician 12/27/15  ________________________________________________________________________ Mother's Name: [name] Type of delivery:  vaginal Breastfeeding Experience:  Pumped and bottle fed with first Maternal Medical Conditions:  Constipation, history of depression, GERD Maternal Medications:  Colace BID   _______________________________________________________________________  Breastfeeding History (Post Discharge) Frequency of breastfeeding: 1-3 hrs Duration of feeding:  20-40 minutes Pumping Type of pump:  Medela pump in style Frequency:  2 times in 24 hrs Volume:  90-12520ml Supplementing 1-2 oz expressed breast milk by bottle 2 times a day (Mom's choice)   Infant Intake and Output Assessment  Voids:  >6 in 24 hrs.  Color:  Clear yellow Stools:  8-10 in 24 hrs.  Color:  Yellow  ________________________________________________________________________  Maternal Breast Assessment  Breast:  Soft and Compressible Nipple:  Flat Pain level:  0 Pain interventions:  Nipple shield  _______________________________________________________________________ Feeding Assessment/Evaluation Initial feeding assessment: Infant's oral assessment:  Variance per LC assessment on 12/14/15- Thick, short labial frenulum, short posterior lingual frenulum Positioning:  Cross cradle Left breast LATCH documentation:  Latch:  2 = Grasps breast easily, tongue down, lips flanged, rhythmical sucking.  Audible swallowing:  2 = Spontaneous and intermittent  Type of nipple:  1 = Flat  Comfort (Breast/Nipple):  2 = Soft / non-tender  Hold (Positioning):  1 = Assistance needed to  correctly position  infant at breast and maintain latch  LATCH score:  9 Attached assessment:  Deep  Lips flanged:  Yes.    Lips untucked:  yes Suck assessment:  Nutritive Tools:  Nipple shield 24 mm Instructed on use and cleaning of tool:  Yes.   Pre-feed weight:  3240 g   Post-feed weight:  3272 g  Amount transferred:  32 ml (20 mins) Amount supplemented: 0 ml Additional Feeding Assessment -  Infant's oral assessment:  Variance per LC assessment on 12/14/15 ositioning:  Cross cradle Right breast LATCH documentation:  Latch:  2 = Grasps breast easily, tongue down, lips flanged, rhythmical sucking.  Audible swallowing:  2 = Spontaneous and intermittent  Type of nipple:  1 = Flat  Comfort (Breast/Nipple):  2 = Soft / non-tender  Hold (Positioning):  1 = Assistance needed to correctly position infant at breast and maintain latch  LATCH score:  9 Attached assessment:  Deep  Lips flanged:  Yes.    Lips untucked:  Yes.   Suck assessment:  Nutritive Tools:  Nipple shield 24 mm Instructed on use and cleaning of tool:  Yes.   Pre-feed weight:  3272 g  Post-feed weight:  3284g Amount transferred:  12 ml (15 minutes) Amount supplemented:  0 ml  Total amount transferred:  44 ml

## 2016-01-31 ENCOUNTER — Telehealth (HOSPITAL_COMMUNITY): Payer: Self-pay | Admitting: Lactation Services

## 2016-01-31 NOTE — Telephone Encounter (Signed)
Returned mom's call in regards to pumping and bottle feeding a 19 week old child. Mom reports infant has been feeding about every 4 hours up until yesterday and was bf more at night. Mom works 4 hours in the am and the infant generally takes a 2 oz bottle while she is gone, Teacher, English as a foreign language infant required 2 bottles for a total of 5 oz while mom was away. Mom reports she BF exclusively when she is home and dad gives bottles when she is at work. Infant recently changed and is eating more during the day for mom while BF less at night in the last few days. When mom is at work she pumps 4-5 oz/pumping. Mom says he is having some spitting, but it has not recently increased. She reports he has a lot of wet diapers and stools every other day, which is what he has been doing. Mom reports she has been having enough milk to feed him and always give him refrigerated/unfrozen milk. She is pumping if a bottle is given in place of BF. She reports she is able to relax with pumping and feels pumping is going well.   Enc her to BF on demand Suspect infant is going through a growth spurt, discussed this with mom Advised mom that she can add a few more pumpings in a week or on weekends if she feels it necessary to get more milk for storage Call Ped if infant refuses to eat, fever, increased spitting, or not acting normally Call back as needed.

## 2016-02-06 ENCOUNTER — Encounter (HOSPITAL_COMMUNITY): Payer: Self-pay | Admitting: Emergency Medicine

## 2016-02-06 ENCOUNTER — Emergency Department (HOSPITAL_COMMUNITY)
Admission: EM | Admit: 2016-02-06 | Discharge: 2016-02-06 | Disposition: A | Discharge status: Home / Self Care | Payer: 59 | Attending: Emergency Medicine | Admitting: Emergency Medicine

## 2016-02-06 DIAGNOSIS — K5900 Constipation, unspecified: Secondary | ICD-10-CM | POA: Insufficient documentation

## 2016-02-06 DIAGNOSIS — J45909 Unspecified asthma, uncomplicated: Secondary | ICD-10-CM | POA: Insufficient documentation

## 2016-02-06 DIAGNOSIS — R109 Unspecified abdominal pain: Secondary | ICD-10-CM | POA: Diagnosis present

## 2016-02-06 NOTE — ED Notes (Signed)
Pt recently had a baby, since then has had problems with constipation. Has recently stopped taking her colace. Now states she needs to have a BM, but feels "like it's too big to come out of the hole." C/o constipation and abdominal pain.

## 2016-02-07 ENCOUNTER — Inpatient Hospital Stay (HOSPITAL_COMMUNITY)
Admission: AD | Admit: 2016-02-07 | Discharge: 2016-02-07 | Disposition: A | Discharge status: Home / Self Care | Payer: 59 | Source: Ambulatory Visit | Attending: Obstetrics | Admitting: Obstetrics

## 2016-02-07 ENCOUNTER — Encounter (HOSPITAL_COMMUNITY): Payer: Self-pay | Admitting: *Deleted

## 2016-02-07 DIAGNOSIS — K219 Gastro-esophageal reflux disease without esophagitis: Secondary | ICD-10-CM | POA: Insufficient documentation

## 2016-02-07 DIAGNOSIS — Z79899 Other long term (current) drug therapy: Secondary | ICD-10-CM | POA: Diagnosis not present

## 2016-02-07 DIAGNOSIS — Z888 Allergy status to other drugs, medicaments and biological substances status: Secondary | ICD-10-CM | POA: Insufficient documentation

## 2016-02-07 DIAGNOSIS — K5903 Drug induced constipation: Secondary | ICD-10-CM | POA: Diagnosis not present

## 2016-02-07 DIAGNOSIS — M549 Dorsalgia, unspecified: Secondary | ICD-10-CM | POA: Insufficient documentation

## 2016-02-07 DIAGNOSIS — K602 Anal fissure, unspecified: Secondary | ICD-10-CM | POA: Diagnosis not present

## 2016-02-07 DIAGNOSIS — R339 Retention of urine, unspecified: Secondary | ICD-10-CM

## 2016-02-07 DIAGNOSIS — K5900 Constipation, unspecified: Secondary | ICD-10-CM | POA: Diagnosis not present

## 2016-02-07 DIAGNOSIS — J45909 Unspecified asthma, uncomplicated: Secondary | ICD-10-CM | POA: Insufficient documentation

## 2016-02-07 DIAGNOSIS — K5909 Other constipation: Secondary | ICD-10-CM | POA: Diagnosis not present

## 2016-02-07 DIAGNOSIS — F329 Major depressive disorder, single episode, unspecified: Secondary | ICD-10-CM | POA: Insufficient documentation

## 2016-02-07 HISTORY — DX: Unspecified asthma, uncomplicated: J45.909

## 2016-02-07 LAB — URINALYSIS, ROUTINE W REFLEX MICROSCOPIC
Bilirubin Urine: NEGATIVE
Glucose, UA: NEGATIVE mg/dL
Hgb urine dipstick: NEGATIVE
Ketones, ur: 15 mg/dL — AB
LEUKOCYTES UA: NEGATIVE
NITRITE: NEGATIVE
PROTEIN: NEGATIVE mg/dL
pH: 6 (ref 5.0–8.0)

## 2016-02-07 MED ORDER — TOLTERODINE TARTRATE 1 MG PO TABS: 1.0000 mg | ORAL_TABLET | Freq: Two times a day (BID) | ORAL | Status: DC | PRN

## 2016-02-07 MED ORDER — PRAMOXINE-HC 1-2.5 % EX CREA: TOPICAL_CREAM | Freq: Three times a day (TID) | CUTANEOUS | Status: DC

## 2016-02-07 NOTE — MAU Note (Signed)
Bladder scan shows 392 ml's of urine, pt unable to urinate @ this time.

## 2016-02-07 NOTE — MAU Note (Addendum)
Pt delivered baby in December , had very bad constipation for 2 weeks.  Has been regular for the last 2 weeks, but became very constipated yesterday around 1100.  States she feels like she is impacted.  Has taken colace, no other laxatives.  Went to [facility] yesterday, but was unable to wait.  Last BM was 3-4 days ago.  Pt also states she has been unable to urinate since last night.  Feels like rectal pressure is preventing her from being able to urinate.

## 2016-02-07 NOTE — MAU Note (Signed)
Bladder scan shows 802 ml's.

## 2016-02-07 NOTE — MAU Provider Note (Signed)
History     CSN: [id number]  Arrival date and time: 02/07/16 4098   First Provider Initiated Contact with Patient 02/07/16 [phone]      Chief Complaint  Patient presents with  . Constipation  . Back Pain   HPI   [name] is a 35 y.o. female G3P2103 status post vaginal Delivery on 12/24 presents to MAU with constipation. This Constipation started following delivery, she thinks from taking narcotics for pain.  She stopped taking the pain medication and her constipation got better. She stopped taking her colace recently and feels the constipation got worse. She had a large BM 3 days ago however feels like she needs to go more and it won't pass. She can feel the ball of stool in her rectum when she sits down and "it is very uncomfortable".   Patient is having trouble urinating.  She urinated yesterday at 1500 and that was the last "normal" urination. She does not feel the urge to urinate.   OB History    Gravida Para Term Preterm AB TAB SAB Ectopic Multiple Living   Past Medical History  Diagnosis Date  . Depression   . Migraines   . LGSIL (low grade squamous intraepithelial lesion) on Pap smear 08/2004  . GERD (gastroesophageal reflux disease)   . Asthma     seasonal    Past Surgical History  Procedure Laterality Date  . Nasal sinus surgery    . Nissenfundo plication  2005    Family History  Problem Relation Age of Onset  . Hypertension Mother   . Endometriosis Sister   . Cancer Maternal Grandmother   . Diabetes Maternal Grandfather     Social History  Substance Use Topics  . Smoking status: Never Smoker   . Smokeless tobacco: Never Used  . Alcohol Use: No     Comment: occ    Allergies:  Allergies  Allergen Reactions  . Nsaids Hives, Swelling and Other (See Comments)    Pt states that her lips swelled.    . Sulfa Antibiotics Swelling and Other (See Comments)    Pt states that her lips swelled.    . Latex Hives and Rash     Prescriptions prior to admission  Medication Sig Dispense Refill Last Dose  . docusate sodium (COLACE) 100 MG capsule Take 1 capsule (100 mg total) by mouth 2 (two) times daily. 60 capsule 0   . oxyCODONE-acetaminophen (ROXICET) 5-325 MG tablet Take 1-2 tablets by mouth every 4 (four) hours as needed for severe pain. 46 tablet 0   . Prenatal Vit-Fe Fumarate-FA (PRENATAL MULTIVITAMIN) TABS tablet Take 1 tablet by mouth See admin instructions. Pt takes two days on and one day off.   Past Week at Unknown time   Results for orders placed or performed during the hospital encounter of 02/07/16 (from the past 48 hour(s))  Urinalysis, Routine w reflex microscopic (not at [facility])     Status: Abnormal   Collection Time: 02/07/16  1:10 PM  Result Value Ref Range   Color, Urine YELLOW YELLOW   APPearance CLEAR CLEAR   Specific Gravity, Urine <1.005 (L) 1.005 - 1.030   pH 6.0 5.0 - 8.0   Glucose, UA NEGATIVE NEGATIVE mg/dL   Hgb urine dipstick NEGATIVE NEGATIVE   Bilirubin Urine NEGATIVE NEGATIVE   Ketones, ur 15 (A) NEGATIVE mg/dL   Protein, ur NEGATIVE NEGATIVE mg/dL   Nitrite NEGATIVE  NEGATIVE   Leukocytes, UA NEGATIVE NEGATIVE    Comment: MICROSCOPIC NOT DONE ON URINES WITH NEGATIVE PROTEIN, BLOOD, LEUKOCYTES, NITRITE, OR GLUCOSE <1000 mg/dL.    Review of Systems  Constitutional: Negative for fever and chills.  Gastrointestinal: Positive for abdominal pain.  Genitourinary: Negative for dysuria, urgency, frequency and hematuria.       + urinary retention    Physical Exam   Blood pressure 118/59, pulse 69, temperature 98.9 F (37.2 C), temperature source Oral, resp. rate 18, unknown if currently breastfeeding.  Physical Exam  Constitutional: She is oriented to person, place, and time. She appears well-developed and well-nourished. No distress.  HENT:  Head: Normocephalic.  GI: There is tenderness in the right lower quadrant, suprapubic area and left lower quadrant. There is no  rigidity, no rebound and no guarding.  Genitourinary: Rectal exam shows tenderness.  Bimanual exam: Cervix anterior, Large amount of stool palpated in rectum  Rectal exam: large amount of soft stool noted in the rectum. Unable to disimpact due to texture.   Musculoskeletal: Normal range of motion.  Neurological: She is alert and oriented to person, place, and time.  Skin: Skin is warm. She is not diaphoretic.  Psychiatric: Her behavior is normal.    MAU Course  Procedures  None  MDM  Bladder scanner shows >800 cc of urine in the bladder. Cath urine done by RN Soaps suds enema ordered. No success   1115: Discussed patient with Dr. Chestine Spore. Patient had a large BM following fleets enema placed by NP.  12:25 bladder scanner shows 390 cc following BM.  Cervical exam following BM without palpated stool mass.  Discussed patient with Dr. Chestine Spore  Foley catheter placed for patient to go home with per Dr. Chestine Spore. Instructions given for home care.     Assessment and Plan   A:    ICD-9-CM ICD-10-CM   1. Urinary retention 788.20 R33.9   2. Acute constipation 564.00 K59.00   3. Anal fissure 565.0 K60.2     P:  Discharge home in stable condition Foley Catheter in place Discussed at home care of constipation RX: Proctofoam, Detrol  Call the office to schedule a follow up in the office on Weds.  Return to MAU if symptoms worsen    Duane Lope, NP 02/07/2016 8:12 PM

## 2016-02-07 NOTE — Discharge Instructions (Signed)
Anal Fissure, Adult An anal fissure is a small tear or crack in the skin around the anus. Bleeding from a fissure usually stops on its own within a few minutes. However, bleeding will often occur again with each bowel movement until the crack heals. CAUSES This condition may be caused by:  Passing large, hard stool (feces).  Frequent diarrhea.  Constipation.  Inflammatory bowel disease (Crohn disease or ulcerative colitis).  Infections.  Anal sex. SYMPTOMS Symptoms of this condition include:  Bleeding from the rectum.  Small amounts of blood seen on your stool, on toilet paper, or in the toilet after a bowel movement.  Painful bowel movements.  Itching or irritation around the anus. DIAGNOSIS A health care provider may diagnose this condition by closely examining the anal area. An anal fissure can usually be seen with careful inspection. In some cases, a rectal exam may be performed, or a short tube (anoscope) may be used to examine the anal canal. TREATMENT Treatment for this condition may include:  Taking steps to avoid constipation. This may include making changes to your diet, such as increasing your intake of fiber or fluid.  Taking fiber supplements. These supplements can soften your stool to help make bowel movements easier. Your health care provider may also prescribe a stool softener if your stool is often hard.  Taking sitz baths. This may help to heal the tear.  Using medicated creams or ointments. These may be prescribed to lessen discomfort. HOME CARE INSTRUCTIONS Eating and Drinking  Avoid foods that may be constipating, such as bananas and dairy products.  Drink enough fluid to keep your urine clear or pale yellow.  Maintain a diet that is high in fruits, whole grains, and vegetables. General Instructions  Keep the anal area as clean and dry as possible.  Take sitz baths as told by your health care provider. Do not use soap in the sitz baths.  Take  over-the-counter and prescription medicines only as told by your health care provider.  Use creams or ointments only as told by your health care provider.  Keep all follow-up visits as told by your health care provider. This is important. SEEK MEDICAL CARE IF:  You have more bleeding.  You have a fever.  You have diarrhea that is mixed with blood.  You continue to have pain.  Your problem is getting worse rather than better.   This information is not intended to replace advice given to you by your health care provider. Make sure you discuss any questions you have with your health care provider.   Document Released: 12/04/2005 Document Revised: 08/25/2015 Document Reviewed: 03/01/2015 Elsevier Interactive Patient Education 2016 [employer].  Constipation, Adult Constipation is when a person has fewer than three bowel movements a week, has difficulty having a bowel movement, or has stools that are dry, hard, or larger than normal. As people grow older, constipation is more common. A low-fiber diet, not taking in enough fluids, and taking certain medicines may make constipation worse.  CAUSES   Certain medicines, such as antidepressants, pain medicine, iron supplements, antacids, and water pills.   Certain diseases, such as diabetes, irritable bowel syndrome (IBS), thyroid disease, or depression.   Not drinking enough water.   Not eating enough fiber-rich foods.   Stress or travel.   Lack of physical activity or exercise.   Ignoring the urge to have a bowel movement.   Using laxatives too much.  SIGNS AND SYMPTOMS   Having fewer than three bowel  movements a week.   Straining to have a bowel movement.   Having stools that are hard, dry, or larger than normal.   Feeling full or bloated.   Pain in the lower abdomen.   Not feeling relief after having a bowel movement.  DIAGNOSIS  Your health care provider will take a medical history and perform a  physical exam. Further testing may be done for severe constipation. Some tests may include:  A barium enema X-ray to examine your rectum, colon, and, sometimes, your small intestine.   A sigmoidoscopy to examine your lower colon.   A colonoscopy to examine your entire colon. TREATMENT  Treatment will depend on the severity of your constipation and what is causing it. Some dietary treatments include drinking more fluids and eating more fiber-rich foods. Lifestyle treatments may include regular exercise. If these diet and lifestyle recommendations do not help, your health care provider may recommend taking over-the-counter laxative medicines to help you have bowel movements. Prescription medicines may be prescribed if over-the-counter medicines do not work.  HOME CARE INSTRUCTIONS   Eat foods that have a lot of fiber, such as fruits, vegetables, whole grains, and beans.  Limit foods high in fat and processed sugars, such as french fries, hamburgers, cookies, candies, and soda.   A fiber supplement may be added to your diet if you cannot get enough fiber from foods.   Drink enough fluids to keep your urine clear or pale yellow.   Exercise regularly or as directed by your health care provider.   Go to the restroom when you have the urge to go. Do not hold it.   Only take over-the-counter or prescription medicines as directed by your health care provider. Do not take other medicines for constipation without talking to your health care provider first.  SEEK IMMEDIATE MEDICAL CARE IF:   You have bright red blood in your stool.   Your constipation lasts for more than 4 days or gets worse.   You have abdominal or rectal pain.   You have thin, pencil-like stools.   You have unexplained weight loss. MAKE SURE YOU:   Understand these instructions.  Will watch your condition.  Will get help right away if you are not doing well or get worse.   This information is not intended  to replace advice given to you by your health care provider. Make sure you discuss any questions you have with your health care provider.   Document Released: 09/01/2004 Document Revised: 12/25/2014 Document Reviewed: 09/15/2013 Elsevier Interactive Patient Education 2016 [employer] Inc[employer]  Acute Urinary Retention, Female Acute urinary retention is the temporary inability to urinate. This is an uncommon problem in women. It can be caused by:  Infection.  A side effect of a medicine.  A problem in a nearby organ that presses or squeezes on the bladder or the urethra (the tube that drains the bladder).  Psychological problems.   Surgery on your bladder, urethra, or pelvic organs that causes obstruction to the outflow of urine from your bladder. HOME CARE INSTRUCTIONS  If you are sent home with a Foley catheter and a drainage system, you will need to discuss the best course of action with your health care provider. While the catheter is in, maintain a good intake of fluids. Keep the drainage bag emptied and lower than your catheter. This is so that contaminated urine will not flow back into your bladder, which could lead to a urinary tract infection. There are two main types  of drainage bags. One is a large bag that usually is used at night. It has a good capacity that will allow you to sleep through the night without having to empty it. The second type is called a leg bag. It has a smaller capacity so it needs to be emptied more frequently. However, the main advantage is that it can be attached by a leg strap and goes underneath your clothing, allowing you the freedom to move about or leave your home. Only take over-the-counter or prescription medicines for pain, discomfort, or fever as directed by your health care provider.  SEEK MEDICAL CARE IF:  You develop a low-grade fever.  You experience spasms or leakage of urine with the spasms. SEEK IMMEDIATE MEDICAL CARE IF:   You develop chills or  fever.  Your catheter stops draining urine.  Your catheter falls out.  You start to develop increased bleeding that does not respond to rest and increased fluid intake. MAKE SURE YOU:  Understand these instructions.  Will watch your condition.  Will get help right away if you are not doing well or get worse.   This information is not intended to replace advice given to you by your health care provider. Make sure you discuss any questions you have with your health care provider.   Document Released: 12/03/2006 Document Revised: 04/20/2015 Document Reviewed: 05/15/2013 Elsevier Interactive Patient Education [employer]

## 2016-02-14 ENCOUNTER — Telehealth (HOSPITAL_COMMUNITY): Payer: Self-pay

## 2016-02-14 NOTE — Telephone Encounter (Signed)
[name] would like to come for an OP appointment related to low milk supply and slow weight gain for her son. He is 2 mos old and in the second percentile. His weight on Feb 24th was 9#5 oz.  [name] recently had a bowel impaction and is curious as to whether this had an impact on her supply.  Recommended that she continue to feed on cue, post pump and feed any expressed milk back to her son. An outpatient appointment has been scheduled for Friday.

## 2016-02-18 ENCOUNTER — Ambulatory Visit (HOSPITAL_COMMUNITY)
Admission: RE | Admit: 2016-02-18 | Discharge: 2016-02-18 | Disposition: A | Discharge status: Home / Self Care | Payer: 59 | Source: Ambulatory Visit | Attending: Obstetrics and Gynecology | Admitting: Obstetrics and Gynecology

## 2016-02-18 NOTE — Lactation Note (Signed)
Lactation Consult  Mother's reason for visit:  Weight check and feeding assessment Visit Type:  outpatient Appointment Notes:  Damon is gaining weight slowly but in the past 7 days he has gained 8 oz. A bowl shaped tongue with a posterior frenum was immediately notice when he was crying.  He is 2 mos old and is eating up to 14 times in 24 hours.  He feeds with a #24 NS and mom was shown how to do a cheek squeeze to increase intraoral vacuum.  Today he transferred 22 ml from the right breast in a cross cradle hold and 26 from the left. He was repositioned on the left side using a FB hold and transferred an additional 12. He was then placed in a FB hold on the right breast and transferred another 6 ml. He was still cueing to eat so mom hand expressed 10 ml which was bottle fed to him. He seemed satisfied after this. His total intake after eating was 76 ml. Mom reports that he usually takes 3 oz when bottle feeding.  She reports that he will eat every 1 -2 hours until 1230 in the morning. Explained to mom that she could pump and bottle feed him for one of the feedings and he may stay satisfied longer.  Mom was also able to post pump 45 ml after the feeding which she stated is atypical.    Plan for now is to continue the work she has been doing and to add a couple of bottle feedings (BM) to help ease her work load. She will use breast massage, hand expression and pumping to help increase her supply.  Mom's breasts are well developed and should make plenty of milk.  Baby's oral anatomy is more than likely contributing to this slow weight gain. He does not have strong suckling and swallowing bursts and a feeding takes 50-60 minutes. Resources were given to mom so that she could educate herself on oral restrictions. 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 Encouraged support groups for weekly weight checks. Consult:  Follow-Up Lactation Consultant:  Soyla DryerJoseph,  Ahsha Hinsley  ________________________________________________________________________ Joan FloresBaby's Name: Lisette Abuamon Francis Dixon Date of Birth: 12/11/2015 Pediatrician: Avis Epleyees Gender: female Gestational Age: 3614w0d (At Birth) Birth Weight: 7 lb 3 oz (3260 g) Weight at Discharge: Weight: 7 lb 1.9 oz (3230 g)Date of Discharge: 12/12/2015 Filed Weights   12/11/15 1644 12/12/15 0000  Weight: 7 lb 3 oz (3260 g) 7 lb 1.9 oz (3230 g)   Last weight taken from location outside of [facility]: 9#5 oz Location:Pediatrician's office Weight today: 9#13 oz      ________________________________________________________________________  Mother's Name: Buelah ManisHeather L Czerwonka Type of delivery:   Breastfeeding Experience:  2 older children more than 10 years ago Maternal Medical Conditions:  vitamin D definciency Maternal Medications:  PNV   ________________________________________________________________________  Breastfeeding History (Post Discharge)  Frequency of breastfeeding:  12-14 times in 24 hours Duration of feeding:  45-50 minutes  Damon gets 1-2 3 oz bottles of expressed BM in 24 hours when his mother is at work  Infant Intake and Output Assessment  Voids:  6+ in 24 hrs.   Stools:  3+ in 24 hrs.    ________________________________________________________________________  Maternal Breast Assessment  Breast:  Soft Nipple:  Erect Pain level:  0 Pain interventions:  NA  _______________________________________________________________________

## 2016-03-07 ENCOUNTER — Ambulatory Visit (HOSPITAL_COMMUNITY)
Admission: RE | Admit: 2016-03-07 | Discharge: 2016-03-07 | Disposition: A | Discharge status: Home / Self Care | Payer: 59 | Source: Ambulatory Visit | Attending: Obstetrics and Gynecology | Admitting: Obstetrics and Gynecology

## 2016-03-07 NOTE — Lactation Note (Signed)
Lactation Consult  Mother's reason for visit:  Weight check Visit Type:  outpatient  Consult:  Follow-Up Lactation Consultant:  Judee ClaraSmith, Haydyn Girvan E  ________________________________________________________________________ Joan FloresBaby's Name: Lisette Abuamon Francis Dixon Date of Birth: 12/11/2015 [place] [facility] [facility] Gender: female Gestational Age: 4642w0d (At Birth) Birth Weight: 7 lb 3 oz (3260 g) Weight at Discharge: Weight: 7 lb 1.9 oz (3230 g)[facility]   [facility] 164[facility] 12/12/15 0000  Weight: 7 lb 3 oz (3260 g) 7 lb 1.9 oz (3230 g)   Last weight taken from location outside of [facility]: 9 lbs 13 ozon 02/18/16 Lactation Office   Location: Lactation Office Weight today: 11 lbs 8.6 oz   Ashlynn brings Damon in for a follow up weight check.  Baby gained 27 oz in 18 days.  He is exclusively breast fed, getting breast milk by bottle when Mom at work.  She works 4 hrs a day.  Since last appointment,2 1/2 weeks ago, baby has had 2 bottles of formula only.  Anajah does pump only 1-2 times a day, but she obtains enough breast milk for his feedings while she is at work.  Baby feeds on cue often during the day, and sleeps 8 hrs at night.  Both nipples very pink, but Mom states that feedings are comfortable.  She uses cradle hold, and doesn't sandwich the breast.  Baby noted to have lips tucked in, so recommended she support and sandwich breast for latch, and then switch into cradle hold.  Baby noted to pop on and off the breast periodically, but latches back on easily without discomfort.  Mom denies any nipple burning pain.  Baby was very contented coming off the breast after his 2 1/2 oz feeding here in office.  Mom does not want to pursue a frenotomy at this time.  Encouraged Mom to come to Breastfeeding Support Group, times and days given.  To call us  prn._______________________________________________________________________  ________________________________________________________________________  Breastfeeding History (Post Discharge)  Frequency of breastfeeding:  On cue Duration of feeding:  15-30 mins    Pumping  Type of pump:  Medela pump in style Frequency:  1-2 times a day Volume:  4-8 ozl  Infant Intake and Output Assessment  Voids: 8-10 in 24 hrs.  Color:  Clear yellow Stools:  1-3 in 24 hrs.  Color:  Yellow  ________________________________________________________________________  Maternal Breast Assessment  Breast:  Soft Nipple:  Reddened Pain level:  3 on right while feeding Pain interventions:  Cream/oil (coconut butter)  _______________________________________________________________________ Feeding Assessment/Evaluation  Initial feeding assessment:  Infant's oral assessment:  Variance Positioning:  Cross cradle Right breast LATCH documentation:  Latch:  2 = Grasps breast easily, tongue down, lips flanged, rhythmical sucking.  Audible swallowing:  2 = Spontaneous and intermittent  Type of nipple:  2 = Everted at rest and after stimulation  Comfort (Breast/Nipple):  2 = Soft / non-tender  Hold (Positioning):  2 = No assistance needed to correctly position infant at breast  LATCH score:  10 Attached assessment:  Deep  Lips flanged:  Yes.    Lips untucked:  Yes.   Suck assessment:  Displays both Pre-feed weight:  5234 g  Post-feed weight:  5282 g Amount transferred:  48 ml  Additional Feeding Assessment -  Infant's oral assessment:  Variance posterior tight frenulum noted at prior visit, upper lip tie noted  Positioning:  Cradle Left breast LATCH documentation:  Latch:  2 = Grasps breast easily, tongue down, lips flanged, rhythmical sucking.  Audible swallowing:  2 = Spontaneous and intermittent  Type of nipple:  2 = Everted at rest and after stimulation  Comfort (Breast/Nipple):  2 = Soft  / non-tender  Hold (Positioning):  2 = No assistance needed to correctly position infant at breast  LATCH score:  10 Attached assessment:  Deep  Lips flanged:  No.    Lips untucked:  Yes.   Suck assessment:  Displays both Pre-feed weight: 5282 g   Post-feed weight: 5308 g  Amount transferred:  26 ml Total amount transferred:  74 ml

## 2016-03-10 ENCOUNTER — Ambulatory Visit (HOSPITAL_COMMUNITY): Admission: RE | Admit: 2016-03-10 | Payer: 59 | Source: Ambulatory Visit

## 2017-03-13 ENCOUNTER — Encounter (HOSPITAL_COMMUNITY): Payer: Self-pay

## 2017-03-13 ENCOUNTER — Inpatient Hospital Stay (HOSPITAL_COMMUNITY)
Admission: EM | Admit: 2017-03-13 | Discharge: 2017-03-18 | DRG: 419 | Disposition: A | Discharge status: Home / Self Care | Payer: 59 | Attending: Surgery | Admitting: Surgery

## 2017-03-13 ENCOUNTER — Emergency Department (HOSPITAL_COMMUNITY): Payer: 59

## 2017-03-13 DIAGNOSIS — K219 Gastro-esophageal reflux disease without esophagitis: Secondary | ICD-10-CM | POA: Diagnosis present

## 2017-03-13 DIAGNOSIS — Z882 Allergy status to sulfonamides status: Secondary | ICD-10-CM | POA: Diagnosis not present

## 2017-03-13 DIAGNOSIS — K8067 Calculus of gallbladder and bile duct with acute and chronic cholecystitis with obstruction: Secondary | ICD-10-CM | POA: Diagnosis present

## 2017-03-13 DIAGNOSIS — K8012 Calculus of gallbladder with acute and chronic cholecystitis without obstruction: Secondary | ICD-10-CM | POA: Diagnosis not present

## 2017-03-13 DIAGNOSIS — Z91040 Latex allergy status: Secondary | ICD-10-CM

## 2017-03-13 DIAGNOSIS — K8051 Calculus of bile duct without cholangitis or cholecystitis with obstruction: Secondary | ICD-10-CM | POA: Diagnosis not present

## 2017-03-13 DIAGNOSIS — Z8249 Family history of ischemic heart disease and other diseases of the circulatory system: Secondary | ICD-10-CM | POA: Diagnosis not present

## 2017-03-13 DIAGNOSIS — R932 Abnormal findings on diagnostic imaging of liver and biliary tract: Secondary | ICD-10-CM

## 2017-03-13 DIAGNOSIS — R000 Tachycardia, unspecified: Secondary | ICD-10-CM | POA: Diagnosis present

## 2017-03-13 DIAGNOSIS — R1013 Epigastric pain: Secondary | ICD-10-CM | POA: Diagnosis not present

## 2017-03-13 DIAGNOSIS — Z886 Allergy status to analgesic agent status: Secondary | ICD-10-CM

## 2017-03-13 DIAGNOSIS — J45909 Unspecified asthma, uncomplicated: Secondary | ICD-10-CM | POA: Diagnosis present

## 2017-03-13 DIAGNOSIS — F329 Major depressive disorder, single episode, unspecified: Secondary | ICD-10-CM | POA: Diagnosis present

## 2017-03-13 DIAGNOSIS — R945 Abnormal results of liver function studies: Secondary | ICD-10-CM | POA: Diagnosis present

## 2017-03-13 DIAGNOSIS — R1010 Upper abdominal pain, unspecified: Secondary | ICD-10-CM | POA: Diagnosis present

## 2017-03-13 DIAGNOSIS — R7989 Other specified abnormal findings of blood chemistry: Secondary | ICD-10-CM | POA: Diagnosis not present

## 2017-03-13 DIAGNOSIS — K760 Fatty (change of) liver, not elsewhere classified: Secondary | ICD-10-CM | POA: Diagnosis present

## 2017-03-13 DIAGNOSIS — Z833 Family history of diabetes mellitus: Secondary | ICD-10-CM | POA: Diagnosis not present

## 2017-03-13 DIAGNOSIS — K8010 Calculus of gallbladder with chronic cholecystitis without obstruction: Secondary | ICD-10-CM | POA: Diagnosis present

## 2017-03-13 DIAGNOSIS — Z6839 Body mass index (BMI) 39.0-39.9, adult: Secondary | ICD-10-CM | POA: Diagnosis not present

## 2017-03-13 DIAGNOSIS — K819 Cholecystitis, unspecified: Secondary | ICD-10-CM

## 2017-03-13 DIAGNOSIS — K5909 Other constipation: Secondary | ICD-10-CM | POA: Diagnosis present

## 2017-03-13 DIAGNOSIS — K8000 Calculus of gallbladder with acute cholecystitis without obstruction: Secondary | ICD-10-CM | POA: Diagnosis not present

## 2017-03-13 DIAGNOSIS — K8020 Calculus of gallbladder without cholecystitis without obstruction: Secondary | ICD-10-CM

## 2017-03-13 DIAGNOSIS — Z9049 Acquired absence of other specified parts of digestive tract: Secondary | ICD-10-CM

## 2017-03-13 DIAGNOSIS — R1011 Right upper quadrant pain: Secondary | ICD-10-CM

## 2017-03-13 LAB — COMPREHENSIVE METABOLIC PANEL
ALK PHOS: 105 U/L (ref 38–126)
ALT: 165 U/L — ABNORMAL HIGH (ref 14–54)
ANION GAP: 6 (ref 5–15)
AST: 250 U/L — ABNORMAL HIGH (ref 15–41)
Albumin: 4.3 g/dL (ref 3.5–5.0)
BUN: 10 mg/dL (ref 6–20)
CALCIUM: 9.5 mg/dL (ref 8.9–10.3)
CO2: 29 mmol/L (ref 22–32)
Chloride: 102 mmol/L (ref 101–111)
Creatinine, Ser: 0.83 mg/dL (ref 0.44–1.00)
GFR calc non Af Amer: >60 mL/min (ref >60)
Glucose, Bld: 129 mg/dL — ABNORMAL HIGH (ref 65–99)
Potassium: 3.6 mmol/L (ref 3.5–5.1)
SODIUM: 137 mmol/L (ref 135–145)
Total Bilirubin: 1.8 mg/dL — ABNORMAL HIGH (ref 0.3–1.2)
Total Protein: 8.3 g/dL — ABNORMAL HIGH (ref 6.5–8.1)

## 2017-03-13 LAB — CBC
HCT: 43.1 % (ref 36.0–46.0)
HCT: 42.5 % (ref 36.0–46.0)
HEMOGLOBIN: 14.1 g/dL (ref 12.0–15.0)
Hemoglobin: 13.9 g/dL (ref 12.0–15.0)
MCH: 28.9 pg (ref 26.0–34.0)
MCH: 28.9 pg (ref 26.0–34.0)
MCHC: 32.7 g/dL (ref 30.0–36.0)
MCHC: 32.7 g/dL (ref 30.0–36.0)
MCV: 88.3 fL (ref 78.0–100.0)
MCV: 88.4 fL (ref 78.0–100.0)
PLATELETS: 258 10*3/uL (ref 150–400)
Platelets: 257 10*3/uL (ref 150–400)
RBC: 4.88 MIL/uL (ref 3.87–5.11)
RBC: 4.81 MIL/uL (ref 3.87–5.11)
RDW: 13.3 % (ref 11.5–15.5)
RDW: 13.2 % (ref 11.5–15.5)
WBC: 9.5 10*3/uL (ref 4.0–10.5)
WBC: 6.5 10*3/uL (ref 4.0–10.5)

## 2017-03-13 LAB — I-STAT BETA HCG BLOOD, ED (MC, WL, AP ONLY): I-stat hCG, quantitative: <5 m[IU]/mL (ref <5)

## 2017-03-13 LAB — URINALYSIS, ROUTINE W REFLEX MICROSCOPIC
BILIRUBIN URINE: NEGATIVE
GLUCOSE, UA: NEGATIVE mg/dL
HGB URINE DIPSTICK: NEGATIVE
Ketones, ur: NEGATIVE mg/dL
NITRITE: NEGATIVE
PH: 6 (ref 5.0–8.0)
Protein, ur: NEGATIVE mg/dL
Specific Gravity, Urine: 1.017 (ref 1.005–1.030)

## 2017-03-13 LAB — CREATININE, SERUM
Creatinine, Ser: 0.81 mg/dL (ref 0.44–1.00)
GFR calc Af Amer: >60 mL/min (ref >60)
GFR calc non Af Amer: >60 mL/min (ref >60)

## 2017-03-13 LAB — SURGICAL PCR SCREEN
MRSA, PCR: NEGATIVE
Staphylococcus aureus: NEGATIVE

## 2017-03-13 LAB — LIPASE, BLOOD: LIPASE: 22 U/L (ref 11–51)

## 2017-03-13 MED ORDER — ACETAMINOPHEN 325 MG PO TABS
650.0000 mg | ORAL_TABLET | Freq: Four times a day (QID) | ORAL | Status: DC | PRN
  Administered 2017-03-14: 650 mg via ORAL
  Filled 2017-03-13: qty 2

## 2017-03-13 MED ORDER — INFLUENZA VAC SPLIT QUAD 0.5 ML IM SUSY
0.5000 mL | PREFILLED_SYRINGE | INTRAMUSCULAR | Status: DC
  Filled 2017-03-13: qty 0.5

## 2017-03-13 MED ORDER — ONDANSETRON HCL 4 MG/2ML IJ SOLN
4.0000 mg | Freq: Four times a day (QID) | INTRAMUSCULAR | Status: DC | PRN
  Administered 2017-03-13 – 2017-03-16 (×7): 4 mg via INTRAVENOUS
  Filled 2017-03-13 (×6): qty 2

## 2017-03-13 MED ORDER — HYDROMORPHONE HCL 1 MG/ML IJ SOLN
1.0000 mg | INTRAMUSCULAR | Status: DC | PRN
  Administered 2017-03-13 – 2017-03-17 (×22): 1 mg via INTRAVENOUS
  Filled 2017-03-13 (×21): qty 1

## 2017-03-13 MED ORDER — ONDANSETRON 4 MG PO TBDP
4.0000 mg | ORAL_TABLET | Freq: Once | ORAL | Status: AC | PRN
  Administered 2017-03-13: 4 mg via ORAL
  Filled 2017-03-13: qty 1

## 2017-03-13 MED ORDER — DIPHENHYDRAMINE HCL 25 MG PO CAPS
25.0000 mg | ORAL_CAPSULE | Freq: Four times a day (QID) | ORAL | Status: DC | PRN
  Administered 2017-03-15: 25 mg via ORAL
  Filled 2017-03-13: qty 1

## 2017-03-13 MED ORDER — MORPHINE SULFATE (PF) 4 MG/ML IV SOLN
4.0000 mg | Freq: Once | INTRAVENOUS | Status: DC
  Filled 2017-03-13: qty 1

## 2017-03-13 MED ORDER — OXYCODONE-ACETAMINOPHEN 5-325 MG PO TABS
1.0000 | ORAL_TABLET | ORAL | Status: DC | PRN
  Administered 2017-03-15: 2 via ORAL
  Administered 2017-03-15: 1 via ORAL
  Filled 2017-03-13: qty 1
  Filled 2017-03-13 (×2): qty 2
  Filled 2017-03-13: qty 1

## 2017-03-13 MED ORDER — ACETAMINOPHEN 650 MG RE SUPP: 650.0000 mg | Freq: Four times a day (QID) | RECTAL | Status: DC | PRN

## 2017-03-13 MED ORDER — ONDANSETRON 4 MG PO TBDP: 4.0000 mg | ORAL_TABLET | Freq: Four times a day (QID) | ORAL | Status: DC | PRN

## 2017-03-13 MED ORDER — PNEUMOCOCCAL VAC POLYVALENT 25 MCG/0.5ML IJ INJ
0.5000 mL | INJECTION | INTRAMUSCULAR | Status: DC
Start: 2017-03-14 — End: 2017-03-18
  Filled 2017-03-13: qty 0.5

## 2017-03-13 MED ORDER — MORPHINE SULFATE (PF) 4 MG/ML IV SOLN
1.0000 mg | INTRAVENOUS | Status: DC | PRN
  Filled 2017-03-13: qty 1

## 2017-03-13 MED ORDER — ENOXAPARIN SODIUM 40 MG/0.4ML ~~LOC~~ SOLN
40.0000 mg | SUBCUTANEOUS | Status: DC
  Filled 2017-03-13: qty 0.4

## 2017-03-13 MED ORDER — DIPHENHYDRAMINE HCL 50 MG/ML IJ SOLN: 25.0000 mg | Freq: Four times a day (QID) | INTRAMUSCULAR | Status: DC | PRN

## 2017-03-13 MED ORDER — DEXTROSE 5 % IV SOLN
2.0000 g | INTRAVENOUS | Status: DC
  Administered 2017-03-13 – 2017-03-17 (×5): 2 g via INTRAVENOUS
  Filled 2017-03-13 (×6): qty 2

## 2017-03-13 MED ORDER — HYDROMORPHONE HCL 1 MG/ML IJ SOLN
1.0000 mg | Freq: Once | INTRAMUSCULAR | Status: AC
  Administered 2017-03-13: 1 mg via INTRAVENOUS
  Filled 2017-03-13: qty 1

## 2017-03-13 MED ORDER — ONDANSETRON HCL 4 MG/2ML IJ SOLN
4.0000 mg | Freq: Once | INTRAMUSCULAR | Status: AC
  Administered 2017-03-13: 4 mg via INTRAVENOUS
  Filled 2017-03-13: qty 2

## 2017-03-13 MED ORDER — KCL IN DEXTROSE-NACL 20-5-0.45 MEQ/L-%-% IV SOLN
INTRAVENOUS | Status: DC
  Administered 2017-03-13 – 2017-03-15 (×4): via INTRAVENOUS
  Filled 2017-03-13 (×7): qty 1000

## 2017-03-13 NOTE — ED Notes (Signed)
THIRD ATTEMPT TO CALL REPORT TO [id number].

## 2017-03-13 NOTE — ED Triage Notes (Signed)
Patient c/o mid upper abdominal pain and N/v x 12 days. Patient states that she had "gallbladder sludge" approx 1 year ago. And has been controlling gallbladder pain with diet.

## 2017-03-13 NOTE — H&P (Signed)
[name] is an 36 y.o. female.   Chief Complaint: 12 days of mid upper abdominal pain.  Had this before especially after her last child. The pain came progressively worse over the last 24 hours and she developed some nausea this a.m. with a little bit of bilious. She has not been able to vomit since her Niesen fundoplication. Because of progressive pain she presented to the ED for evaluation.  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  She now presents with 12 days of abdominal pain.  Pain became significantly worse last evening with increased nausea this a.m.  Work up in the ED shows she is afebrile, blood pressure was up; she was slightly tachycardic on admission. Labs show a normal CBC. CMP shows a lipase of 22 and AST of 250, ALT of 165 total bilirubin of 1.8. There is a possible UTI and cultures pending. Abdominal ultrasound from 08/02/15 showed gallbladder sludge, no cholelithiasis or cholecystitis. Abdominal ultrasound the right upper quadrant today: Multiple gallstones mobile within the gallbladder. Borderline gallbladder wall thickening. No surrounding fluid. Negative Murphy sign after having received pain medication. Common bile duct was 5.1 mm and within normal limits. Impression was numerous small mobile gallstones within the gallbladder. Borderline gallbladder wall thickening possible early cholecystitis we are asked to see.  Past Medical History:  Diagnosis Date  . Asthma    seasonal  . Depression   . GERD (gastroesophageal reflux disease)   . LGSIL (low grade squamous intraepithelial lesion) on Pap smear 08/2004  . Migraines     Past Surgical History:  Procedure Laterality Date  . NASAL SINUS SURGERY    . nissenfundo plication  0321    Family History  Problem Relation Age of Onset  . Hypertension Mother   . Endometriosis Sister   . Cancer Maternal Grandmother   . Diabetes Maternal Grandfather    Social History:  reports that she has never  smoked. She has never used smokeless tobacco. She reports that she drinks alcohol. She reports that she does not use drugs.  Allergies:  Allergies  Allergen Reactions  . Nsaids Hives, Swelling and Other (See Comments)    Pt states that her lips swelled.    . Sulfa Antibiotics Swelling and Other (See Comments)    Pt states that her lips swelled.    . Latex Hives and Rash     Prior to Admission medications   Medication Sig Start Date End Date Taking? Authorizing Provider  acetaminophen (TYLENOL) 500 MG tablet Take 1,000 mg by mouth every 6 (six) hours as needed for mild pain, moderate pain, fever or headache.   Yes Historical Provider, MD  oxyCODONE-acetaminophen (PERCOCET/ROXICET) 5-325 MG tablet Take 1-2 tablets by mouth every 4 (four) hours as needed for severe pain.   Yes Historical Provider, MD     Results for orders placed or performed during the hospital encounter of 03/13/17 (from the past 48 hour(s))  Urinalysis, Routine w reflex microscopic     Status: Abnormal   Collection Time: 03/13/17  9:29 AM  Result Value Ref Range   Color, Urine YELLOW YELLOW   APPearance HAZY (A) CLEAR   Specific Gravity, Urine 1.017 1.005 - 1.030   pH 6.0 5.0 - 8.0   Glucose, UA NEGATIVE NEGATIVE mg/dL   Hgb urine dipstick NEGATIVE NEGATIVE   Bilirubin Urine NEGATIVE NEGATIVE   Ketones, ur NEGATIVE NEGATIVE mg/dL   Protein, ur NEGATIVE NEGATIVE mg/dL   Nitrite NEGATIVE NEGATIVE  Leukocytes, UA TRACE (A) NEGATIVE   RBC / HPF 0-5 0 - 5 RBC/hpf   WBC, UA 0-5 0 - 5 WBC/hpf   Bacteria, UA MANY (A) NONE SEEN   Squamous Epithelial / LPF 0-5 (A) NONE SEEN   Mucous PRESENT    Hyaline Casts, UA PRESENT   Lipase, blood     Status: None   Collection Time: 03/13/17 11:20 AM  Result Value Ref Range   Lipase 22 11 - 51 U/L  Comprehensive metabolic panel     Status: Abnormal   Collection Time: 03/13/17 11:20 AM  Result Value Ref Range   Sodium 137 135 - 145 mmol/L   Potassium 3.6 3.5 - 5.1 mmol/L    Chloride 102 101 - 111 mmol/L   CO2 29 22 - 32 mmol/L   Glucose, Bld 129 (H) 65 - 99 mg/dL   BUN 10 6 - 20 mg/dL   Creatinine, Ser 0.83 0.44 - 1.00 mg/dL   Calcium 9.5 8.9 - 10.3 mg/dL   Total Protein 8.3 (H) 6.5 - 8.1 g/dL   Albumin 4.3 3.5 - 5.0 g/dL   AST 250 (H) 15 - 41 U/L   ALT 165 (H) 14 - 54 U/L   Alkaline Phosphatase 105 38 - 126 U/L   Total Bilirubin 1.8 (H) 0.3 - 1.2 mg/dL   GFR calc non Af Amer >60 >60 mL/min   GFR calc Af Amer >60 >60 mL/min    Comment: (NOTE) The eGFR has been calculated using the CKD EPI equation. This calculation has not been validated in all clinical situations. eGFR's persistently <60 mL/min signify possible Chronic Kidney Disease.    Anion gap 6 5 - 15  CBC     Status: None   Collection Time: 03/13/17 11:20 AM  Result Value Ref Range   WBC 9.5 4.0 - 10.5 K/uL   RBC 4.88 3.87 - 5.11 MIL/uL   Hemoglobin 14.1 12.0 - 15.0 g/dL   HCT 43.1 36.0 - 46.0 %   MCV 88.3 78.0 - 100.0 fL   MCH 28.9 26.0 - 34.0 pg   MCHC 32.7 30.0 - 36.0 g/dL   RDW 13.3 11.5 - 15.5 %   Platelets 257 150 - 400 K/uL  I-Stat beta hCG blood, ED     Status: None   Collection Time: 03/13/17 11:48 AM  Result Value Ref Range   I-stat hCG, quantitative <5.0 <5 mIU/mL   Comment 3            Comment:   GEST. AGE      CONC.  (mIU/mL)   <=1 WEEK        5 - 50     2 WEEKS       50 - 500     3 WEEKS       100 - 10,000     4 WEEKS     1,000 - 30,000        FEMALE AND NON-PREGNANT FEMALE:     LESS THAN 5 mIU/mL    US Abdomen Limited Ruq  Result Date: 03/13/2017 CLINICAL DATA:  Right upper quadrant abdominal pain over the last week. EXAM: US ABDOMEN LIMITED - RIGHT UPPER QUADRANT COMPARISON:  08/02/2015 FINDINGS: Gallbladder: Multiple small gallstones mobile within the gallbladder. Borderline gallbladder wall thickening. No surrounding fluid. Negative Murphy sign, the pain medicine has been administered. Common bile duct: Diameter: 5.1 mm, within normal limits Liver: No focal lesion  identified. Within normal limits in parenchymal echogenicity. IMPRESSION: Numerous  small stones mobile within the gallbladder. Borderline gallbladder wall thickening. Cannot assess accurately for Murphy sign given previous pain medicine minutes duration. The findings could represent early cholecystitis. Stones are of a size that could pass into the ductal system. Common duct within normal limits. Electronically Signed   By: Nelson Chimes M.D.   On: 03/13/2017 13:44    Review of Systems  Constitutional: Negative.   HENT: Negative.   Eyes: Negative.   Respiratory: Positive for shortness of breath (DOE). Negative for cough, hemoptysis, sputum production and wheezing.        History of asthma that bothers her mostly in cold weather.  Cardiovascular: Negative.   Gastrointestinal: Positive for abdominal pain, constipation (She has some issues with chronic constipation), nausea and vomiting. Negative for blood in stool, diarrhea, heartburn and melena.  Genitourinary: Negative.   Musculoskeletal: Negative.   Skin: Negative.   Neurological: Negative.   Endo/Heme/Allergies: Negative.   Psychiatric/Behavioral: Negative.     Blood pressure 122/79, pulse 75, temperature 98.9 F (37.2 C), temperature source Oral, resp. rate 12, height '5\' 6"'  (1.676 m), weight 109.8 kg (242 lb), last menstrual period 02/15/2017, SpO2 99 %, unknown if currently breastfeeding. Physical Exam  Constitutional: She is oriented to person, place, and time. She appears well-developed and well-nourished. No distress.  66 inches/242 pounds/BMI 39  HENT:  Head: Normocephalic and atraumatic.  Mouth/Throat: No oropharyngeal exudate.  Eyes: Right eye exhibits no discharge. Left eye exhibits no discharge. No scleral icterus.  Neck: Normal range of motion. Neck supple. No JVD present. No tracheal deviation present. No thyromegaly present.  Cardiovascular: Normal rate, regular rhythm, normal heart sounds and intact distal pulses.   No  murmur heard. Respiratory: Effort normal and breath sounds normal. No respiratory distress. She has no wheezes. She has no rales. She exhibits no tenderness.  GI: Soft. She exhibits no distension and no mass. There is tenderness (Is somewhat tender both on the right and left more so on the right.). There is no rebound and no guarding.  Musculoskeletal: She exhibits no edema or tenderness.  Lymphadenopathy:    She has no cervical adenopathy.  Neurological: She is alert and oriented to person, place, and time. No cranial nerve deficit.  Skin: Skin is warm and dry. No rash noted. She is not diaphoretic. No erythema. No pallor.  Psychiatric: She has a normal mood and affect. Her behavior is normal. Judgment and thought content normal.     Assessment/Plan Cholelithiasis/cholecystitis. Status post Niesen fundoplication 9449, Dr. Fanny Skates History of GERD History of migraines BMI 39  Plan: Admit hydrated overnight, recheck labs in the a.m. If her LFTs rise, consider MRCP and GI evaluation for ERCP. If LFTs remain normal possible cholecystectomy tomorrow. Start her on IV Rocephin tonight.  Kenleigh Toback, PA-C 03/13/2017, 2:19 PM

## 2017-03-13 NOTE — ED Notes (Signed)
FIRST ATTEMPT TO CALL REPORT TO 5W.

## 2017-03-13 NOTE — ED Notes (Signed)
WILL TRANSPORT PT TO 5W 1528-1. AAOX4. PT IN NO APPARENT DISTRESS OR PAIN. THE OPPORTUNITY TO ASK QUESTIONS WAS PROVIDED.

## 2017-03-13 NOTE — ED Notes (Signed)
Chaplain conferred with pt regarding request for [facility] priest.  [facility].  Will follow up with parish tomorrow with time of surgery details.     Belva CromeStalnaker, Latangela Mccomas Wayne MDiv

## 2017-03-13 NOTE — ED Provider Notes (Signed)
[facility]-EMERGENCY DEPT Provider Note   CSN: [id number] Arrival date & time: 03/13/17  [id number]  By signing my name below, I, Teofilo PodMatthew P. Jamison, attest that this documentation has been prepared under the direction and in the presence of Sharilyn SitesLisa Dink Creps, PA-C. Electronically Signed: Teofilo PodMatthew P. Jamison, ED Scribe. 03/13/2017. 11:20 AM.    History   Chief Complaint Chief Complaint  Patient presents with  . Abdominal Pain  . Emesis    The history is provided by the patient. No language interpreter was used.   HPI Comments:  [name] is a 36 y.o. female who presents to the Emergency Department complaining of constant mid upper abdominal pain x 12 days. Pt complains of associated nausea. Pt reports hx of "gallbladder sludge" 1 year ago. Pt had blood work at her PCP yesterday to check her liver function and white cell count, however has no results back yet. Pt took OTC medication for pain with no relief. Pt denies vomiting, diarrhea, fever, chills, sweats.  No chest pain or SOB.  States she was never evaluated by surgery in the past for gallbladder issues, states she has controlled with diet.  Past Medical History:  Diagnosis Date  . Asthma    seasonal  . Depression   . GERD (gastroesophageal reflux disease)   . LGSIL (low grade squamous intraepithelial lesion) on Pap smear 08/2004  . Migraines     Patient Active Problem List   Diagnosis Date Noted  . PROM (premature rupture of membranes) 12/11/2015  . Spontaneous vaginal delivery 12/11/2015  . Pregnant 04/30/2015  . H/O vitamin D deficiency 04/30/2015    Past Surgical History:  Procedure Laterality Date  . NASAL SINUS SURGERY    . nissenfundo plication  2005    OB History    Gravida Para Term Preterm AB Living   3 3 2 1   3    SAB TAB Ectopic Multiple Live Births           1       Home Medications    Prior to Admission medications   Medication Sig Start Date End Date Taking? Authorizing Provider  docusate sodium  (COLACE) 100 MG capsule Take 1 capsule (100 mg total) by mouth 2 (two) times daily. 12/12/15   Waynard ReedsKendra Ross, MD  pramoxine-hydrocortisone cream Apply topically 3 (three) times daily. 02/07/16   Duane LopeJennifer I Rasch, NP  Prenatal Vit-Fe Fumarate-FA (PRENATAL MULTIVITAMIN) TABS tablet Take 1 tablet by mouth See admin instructions. Pt takes two days on and one day off.    Historical Provider, MD  tolterodine (DETROL) 1 MG tablet Take 1 tablet (1 mg total) by mouth 2 (two) times daily as needed. 02/07/16   Duane LopeJennifer I Rasch, NP    Family History Family History  Problem Relation Age of Onset  . Hypertension Mother   . Endometriosis Sister   . Cancer Maternal Grandmother   . Diabetes Maternal Grandfather     Social History Social History  Substance Use Topics  . Smoking status: Never Smoker  . Smokeless tobacco: Never Used  . Alcohol use 0.0 oz/week     Comment: occ     Allergies   Nsaids; Sulfa antibiotics; and Latex   Review of Systems Review of Systems  Constitutional: Negative for fever.  Gastrointestinal: Positive for abdominal pain and nausea. Negative for diarrhea and vomiting.  All other systems reviewed and are negative.    Physical Exam Updated Vital Signs BP (!) 147/91 (BP Location: Left Arm)   Pulse  86   Temp 98.5 F (36.9 C) (Oral)   Resp 16   Ht 5\' 6"  (1.676 m)   Wt 242 lb (109.8 kg)   LMP 02/15/2017 (Approximate)   SpO2 97%   BMI 39.06 kg/m   Physical Exam  Constitutional: She is oriented to person, place, and time. She appears well-developed and well-nourished.  Appears uncomfortable  HENT:  Head: Normocephalic and atraumatic.  Mouth/Throat: Oropharynx is clear and moist.  Eyes: Conjunctivae and EOM are normal. Pupils are equal, round, and reactive to light.  Neck: Normal range of motion.  Cardiovascular: Normal rate, regular rhythm and normal heart sounds.   Pulmonary/Chest: Effort normal and breath sounds normal.  Abdominal: Soft. Bowel sounds are  normal. There is tenderness in the right upper quadrant and epigastric area.    Right upper quadrant and epigastric tenderness without definitive Murphy's sign, no rebound or guarding, normal bowel sounds, no apparent distention  Musculoskeletal: Normal range of motion.  Neurological: She is alert and oriented to person, place, and time.  Skin: Skin is warm and dry.  Psychiatric: She has a normal mood and affect.  Nursing note and vitals reviewed.    ED Treatments / Results  DIAGNOSTIC STUDIES:  Oxygen Saturation is 97% on RA, normal by my interpretation.    COORDINATION OF CARE:  11:20 AM Discussed treatment plan with pt at bedside and pt agreed to plan.   Labs (all labs ordered are listed, but only abnormal results are displayed) Labs Reviewed  COMPREHENSIVE METABOLIC PANEL - Abnormal; Notable for the following:       Result Value   Glucose, Bld 129 (*)    Total Protein 8.3 (*)    AST 250 (*)    ALT 165 (*)    Total Bilirubin 1.8 (*)    All other components within normal limits  URINALYSIS, ROUTINE W REFLEX MICROSCOPIC - Abnormal; Notable for the following:    APPearance HAZY (*)    Leukocytes, UA TRACE (*)    Bacteria, UA MANY (*)    Squamous Epithelial / LPF 0-5 (*)    All other components within normal limits  URINE CULTURE  LIPASE, BLOOD  CBC  I-STAT BETA HCG BLOOD, ED ([facility], [facility], [facility] ONLY)    EKG  EKG Interpretation None       Radiology US Abdomen Limited Ruq  Result Date: 03/13/2017 CLINICAL DATA:  Right upper quadrant abdominal pain over the last week. EXAM: US ABDOMEN LIMITED - RIGHT UPPER QUADRANT COMPARISON:  08/02/2015 FINDINGS: Gallbladder: Multiple small gallstones mobile within the gallbladder. Borderline gallbladder wall thickening. No surrounding fluid. Negative Murphy sign, the pain medicine has been administered. Common bile duct: Diameter: 5.1 mm, within normal limits Liver: No focal lesion identified. Within normal limits in parenchymal  echogenicity. IMPRESSION: Numerous small stones mobile within the gallbladder. Borderline gallbladder wall thickening. Cannot assess accurately for Murphy sign given previous pain medicine minutes duration. The findings could represent early cholecystitis. Stones are of a size that could pass into the ductal system. Common duct within normal limits. Electronically Signed   By: Paulina Fusi M.D.   On: 03/13/2017 13:44    Procedures Procedures (including critical care time)  Medications Ordered in ED Medications  ondansetron (ZOFRAN-ODT) disintegrating tablet 4 mg (4 mg Oral Given 03/13/17 0931)     Initial Impression / Assessment and Plan / ED Course  I have reviewed the triage vital signs and the nursing notes.  Pertinent labs & imaging results that were available during  my care of the patient were reviewed by me and considered in my medical decision making (see chart for details).  36 y.o F here with upper abdominal pain.  Hx of gallbladder sludge, has been managing attacks with diet control.  On exam she does appear uncomfortable. She has tenderness in the right upper quadrant and epigastric region without peritonitis. Labwork obtained, she does have some elevation of her LFTs as well as bilirubin. White count is normal. Repeat ultrasound obtained here today revealing multiple gallstones, borderline wall thickening. Pain and nausea controlled well here.  Question if this is early cholecystitis. Case discussed with general surgery-- they have evaluated in the ED and will admit for ongoing care.  Final Clinical Impressions(s) / ED Diagnoses   Final diagnoses:  RUQ pain  Cholecystitis    New Prescriptions New Prescriptions   No medications on file   I personally performed the services described in this documentation, which was scribed in my presence. The recorded information has been reviewed and is accurate.    Garlon Hatchet, PA-C 03/13/17 1539    Jacalyn Lefevre, MD 03/13/17  [phone]

## 2017-03-13 NOTE — ED Notes (Signed)
SECOND ATTEMPT TO CALL REPORT TO 5W 1528-1. 

## 2017-03-13 NOTE — ED Notes (Signed)
Bed: [facility] Expected date:  Expected time:  Means of arrival:  Comments: 

## 2017-03-14 ENCOUNTER — Inpatient Hospital Stay (HOSPITAL_COMMUNITY): Payer: 59

## 2017-03-14 DIAGNOSIS — R1013 Epigastric pain: Secondary | ICD-10-CM

## 2017-03-14 DIAGNOSIS — K8012 Calculus of gallbladder with acute and chronic cholecystitis without obstruction: Secondary | ICD-10-CM

## 2017-03-14 DIAGNOSIS — R7989 Other specified abnormal findings of blood chemistry: Secondary | ICD-10-CM

## 2017-03-14 LAB — COMPREHENSIVE METABOLIC PANEL
ALBUMIN: 3.7 g/dL (ref 3.5–5.0)
ALBUMIN: 4.1 g/dL (ref 3.5–5.0)
ALK PHOS: 155 U/L — AB (ref 38–126)
ALK PHOS: 190 U/L — AB (ref 38–126)
ALT: 829 U/L — ABNORMAL HIGH (ref 14–54)
ALT: 815 U/L — AB (ref 14–54)
ANION GAP: 10 (ref 5–15)
AST: 634 U/L — AB (ref 15–41)
AST: 424 U/L — ABNORMAL HIGH (ref 15–41)
Anion gap: 9 (ref 5–15)
BILIRUBIN TOTAL: 3.3 mg/dL — AB (ref 0.3–1.2)
BUN: 7 mg/dL (ref 6–20)
BUN: 5 mg/dL — ABNORMAL LOW (ref 6–20)
CALCIUM: 9.3 mg/dL (ref 8.9–10.3)
CALCIUM: 9.2 mg/dL (ref 8.9–10.3)
CHLORIDE: 101 mmol/L (ref 101–111)
CO2: 27 mmol/L (ref 22–32)
CO2: 28 mmol/L (ref 22–32)
CREATININE: 0.66 mg/dL (ref 0.44–1.00)
CREATININE: 0.64 mg/dL (ref 0.44–1.00)
Chloride: 101 mmol/L (ref 101–111)
GFR calc Af Amer: >60 mL/min (ref >60)
GFR calc non Af Amer: >60 mL/min (ref >60)
GFR calc non Af Amer: >60 mL/min (ref >60)
GLUCOSE: 137 mg/dL — AB (ref 65–99)
GLUCOSE: 108 mg/dL — AB (ref 65–99)
Potassium: 4.1 mmol/L (ref 3.5–5.1)
Potassium: 3.9 mmol/L (ref 3.5–5.1)
SODIUM: 138 mmol/L (ref 135–145)
Sodium: 138 mmol/L (ref 135–145)
TOTAL PROTEIN: 7.4 g/dL (ref 6.5–8.1)
Total Bilirubin: 4.4 mg/dL — ABNORMAL HIGH (ref 0.3–1.2)
Total Protein: 8 g/dL (ref 6.5–8.1)

## 2017-03-14 LAB — CBC
HCT: 41.6 % (ref 36.0–46.0)
Hemoglobin: 13.5 g/dL (ref 12.0–15.0)
MCH: 28.9 pg (ref 26.0–34.0)
MCHC: 32.5 g/dL (ref 30.0–36.0)
MCV: 89.1 fL (ref 78.0–100.0)
PLATELETS: 247 10*3/uL (ref 150–400)
RBC: 4.67 MIL/uL (ref 3.87–5.11)
RDW: 13.4 % (ref 11.5–15.5)
WBC: 6.1 10*3/uL (ref 4.0–10.5)

## 2017-03-14 LAB — LIPASE, BLOOD: LIPASE: 20 U/L (ref 11–51)

## 2017-03-14 LAB — HIV ANTIBODY (ROUTINE TESTING W REFLEX): HIV Screen 4th Generation wRfx: NONREACTIVE

## 2017-03-14 LAB — URINE CULTURE

## 2017-03-14 LAB — PROTIME-INR
INR: 1
Prothrombin Time: 13.2 seconds (ref 11.4–15.2)

## 2017-03-14 LAB — GLUCOSE, CAPILLARY: GLUCOSE-CAPILLARY: 118 mg/dL — AB (ref 65–99)

## 2017-03-14 MED ORDER — PROMETHAZINE HCL 25 MG/ML IJ SOLN
12.5000 mg | Freq: Four times a day (QID) | INTRAMUSCULAR | Status: DC | PRN
  Administered 2017-03-14 – 2017-03-15 (×3): 12.5 mg via INTRAVENOUS
  Filled 2017-03-14 (×3): qty 1

## 2017-03-14 MED ORDER — GADOBENATE DIMEGLUMINE 529 MG/ML IV SOLN
20.0000 mL | Freq: Once | INTRAVENOUS | Status: AC | PRN
Start: 2017-03-14 — End: 2017-03-14
  Administered 2017-03-14: 20 mL via INTRAVENOUS

## 2017-03-14 NOTE — Consult Note (Signed)
 Referring Provider: [facility] Primary Care Physician:  BADGER,MICHAEL C, MD Primary Gastroenterologist: Pt of Dr. James Edwards for many years, now unassigned as [facility] is unable to see pt  Reason for Consultation: abnormal LFTs    Attending physician's note   I have taken a history, examined the patient and reviewed the chart. I agree with the Advanced Practitioner's note, impression and recommendations. Upper abdominal pain, cholelithiasis and elevated LFTs. US shows cholelithiasis, borderline GB wall thickness and a normal CBD measuring 5.1 mm in diameter. Concerning for choledocholithiasis however CBD appears normal and it is not dilated. Younger female so she is a higher than average risk for ERCP pancreatitis. MRCP ordered and if choledocholithiasis is noted will proceed with ERCP. If MRCP does not show choledocholithiasis would proceed with lap chole with IOC.   Malcolm Stark, MD FACG [phone] Mon-Fri 8a-5p [phone] after 5p, weekends, holidays   ASSESSMENT AND PLAN:    1. 35 year old with radiating upper abdominal pain, nausea, cholelithiasis, and rising LFTs. No biliary duct dilation on U/S.  -Patient at increased risk for pancreatitis. Will obtain MRCP to evaluate for choledocholithiasis.  -If CBD stone found then will proceed with ERCP with stone extraction and probable sphincterotomy.  -check coags -trend LFTs  2. Hx of GERD / Asymptomatic since Nissen fundoplication in her 20's.   3 Chronic constipation.  Of note, patient has been trying to reestablish care with Dr. Edwards at [facility]. She was a longstanding patient of his but seen since 2002. She has had difficulty getting appointment with [facility]. Saw Dr. Hung once in 2010 for celiac evaluation (PCP sent her). Patient didn't even realize Dr. Hung was a GI doc at the time  4. Obesity    HPI: [name] is a 35 y.o. female who presented to ED yesterday with upper abdominal pain radiating  through to back and nausea. Patient seen at [facility] a year ago with same pain, told she had gallbladder sludge. She made dietary changes and has only had a couple of episodes of pain over the last years. Approximately 10 days ago she consumed food which has typically caused her to have epigastric pain. Since then she has continued to have intermittent pain which escalated a couple of days ago. Patient saw PCP, LFTs were normal. Pain progressed and she presented to ED yesterday.  LFTs elevated in ED, they have continued to rise. Ultrasound reveals cholelithiasis, borderline gallbladder wall thickening, CBD only 5.1 Currently her pain is controlled with analgesics.   Patient has a long history of GERD and chronic constipation. She is s/p Nissen fundoplication in her 20's. No dysphagia unless she eats too quickly. No GERD sx.   Past Medical History:  Diagnosis Date  . Asthma    seasonal  . Depression   . GERD (gastroesophageal reflux disease)   . LGSIL (low grade squamous intraepithelial lesion) on Pap smear 08/2004  . Migraines     Past Surgical History:  Procedure Laterality Date  . NASAL SINUS SURGERY    . nissenfundo plication  2005    Prior to Admission medications   Medication Sig Start Date End Date Taking? Authorizing Provider  acetaminophen (TYLENOL) 500 MG tablet Take 1,000 mg by mouth every 6 (six) hours as needed for mild pain, moderate pain, fever or headache.   Yes Historical Provider, MD  oxyCODONE-acetaminophen (PERCOCET/ROXICET) 5-325 MG tablet Take 1-2 tablets by mouth every 4 (four) hours as needed for severe pain.     Yes Historical Provider, MD    Current Facility-Administered Medications  Medication Dose Route Frequency Provider Last Rate Last Dose  . acetaminophen (TYLENOL) tablet 650 mg  650 mg Oral Q6H PRN Willard Jennings, PA-C       Or  . acetaminophen (TYLENOL) suppository 650 mg  650 mg Rectal Q6H PRN Willard Jennings, PA-C      . cefTRIAXone (ROCEPHIN)  2 g in dextrose 5 % 50 mL IVPB  2 g Intravenous Q24H Willard Jennings, PA-C   Stopped at 03/13/17 1903  . dextrose 5 % and 0.45 % NaCl with KCl 20 mEq/L infusion   Intravenous Continuous David Newman, MD 100 mL/hr at 03/14/17 0916    . diphenhydrAMINE (BENADRYL) capsule 25 mg  25 mg Oral Q6H PRN Willard Jennings, PA-C       Or  . diphenhydrAMINE (BENADRYL) injection 25 mg  25 mg Intravenous Q6H PRN Willard Jennings, PA-C      . enoxaparin (LOVENOX) injection 40 mg  40 mg Subcutaneous Q24H Willard Jennings, PA-C      . HYDROmorphone (DILAUDID) injection 1 mg  1 mg Intravenous Q3H PRN David Newman, MD   1 mg at 03/14/17 0916  . Influenza vac split quadrivalent PF (FLUARIX) injection 0.5 mL  0.5 mL Intramuscular Tomorrow-1000 Julie Haviland, MD      . ondansetron (ZOFRAN-ODT) disintegrating tablet 4 mg  4 mg Oral Q6H PRN Willard Jennings, PA-C       Or  . ondansetron (ZOFRAN) injection 4 mg  4 mg Intravenous Q6H PRN Willard Jennings, PA-C   4 mg at 03/14/17 0606  . oxyCODONE-acetaminophen (PERCOCET/ROXICET) 5-325 MG per tablet 1-2 tablet  1-2 tablet Oral Q4H PRN Willard Jennings, PA-C      . pneumococcal 23 valent vaccine (PNU-IMMUNE) injection 0.5 mL  0.5 mL Intramuscular Tomorrow-1000 Julie Haviland, MD        Allergies as of 03/13/2017 - Review Complete 03/13/2017  Allergen Reaction Noted  . Nsaids Hives, Swelling, and Other (See Comments) 10/13/2013  . Sulfa antibiotics Swelling and Other (See Comments) 10/13/2013  . Latex Hives and Rash 10/13/2013    Family History  Problem Relation Age of Onset  . Hypertension Mother   . Endometriosis Sister   . Cancer Maternal Grandmother   . Diabetes Maternal Grandfather     Social History   Social History  . Marital status: Married    Spouse name: N/A  . Number of children: N/A  . Years of education: N/A   Occupational History  . Not on file.   Social History Main Topics  . Smoking status: Never Smoker  . Smokeless tobacco: Never Used   . Alcohol use 0.0 oz/week     Comment: occ  . Drug use: No  . Sexual activity: Yes   Other Topics Concern  . Not on file   Social History Narrative  . No narrative on file    Review of Systems: All systems reviewed and negative except where noted in HPI.  Physical Exam: Vital signs in last 24 hours: Temp:  [97.6 F (36.4 C)-98.9 F (37.2 C)] 98.4 F (36.9 C) (03/28 0548) Pulse Rate:  [75-86] 81 (03/28 0548) Resp:  [12-20] 16 (03/28 0548) BP: (109-141)/(67-97) 116/78 (03/28 0548) SpO2:  [97 %-100 %] 100 % (03/28 0548) Weight:  [240 lb 11.9 oz (109.2 kg)] 240 lb 11.9 oz (109.2 kg) (03/27 2043) Last BM Date: 03/13/17 General:   Alert, obese white female in NAD Eyes:  Sclera clear, no icterus.     Conjunctiva pink. Ears:  Normal auditory acuity. Nose:  No deformity, discharge,  or lesions. Neck:  Supple; no masses  Lungs:  Clear throughout to auscultation.   No wheezes, crackles, or rhonchi. Heart:  Regular rate and rhythm; no murmurs, no edema Abdomen:  Soft,nontender, BS active,no palp mass   Rectal:  Deferred  Msk:  Symmetrical without gross deformities. Psych: Pleasant , cooperative . Neurologic:  Alert and  oriented x4;  grossly normal neurologically. Skin:  Intact without significant lesions or rashes..  Intake/Output from previous day: 03/27 0701 - 03/28 0700 In: 726.7 [I.V.:676.7; IV Piggyback:50] Out: 750 [Urine:750] Intake/Output this shift: Total I/O In: 400 [I.V.:400] Out: -   Lab Results:  Recent Labs  03/13/17 1120 03/13/17 1722 03/14/17 0449  WBC 9.5 6.5 6.1  HGB 14.1 13.9 13.5  HCT 43.1 42.5 41.6  PLT 257 258 247   BMET  Recent Labs  03/13/17 1120 03/13/17 1722 03/14/17 0449  NA 137  --  138  K 3.6  --  4.1  CL 102  --  101  CO2 29  --  27  GLUCOSE 129*  --  137*  BUN 10  --  7  CREATININE 0.83 0.81 0.66  CALCIUM 9.5  --  9.3   LFT  Recent Labs  03/14/17 0449  PROT 7.4  ALBUMIN 3.7  AST 634*  ALT 829*  ALKPHOS 155*    BILITOT 3.3*   PT/INR No results for input(s): LABPROT, INR in the last 72 hours. Hepatitis Panel No results for input(s): HEPBSAG, HCVAB, HEPAIGM, HEPBIGM in the last 72 hours.   Studies/Results: Us Abdomen Limited Ruq  Result Date: 03/13/2017 CLINICAL DATA:  Right upper quadrant abdominal pain over the last week. EXAM: US ABDOMEN LIMITED - RIGHT UPPER QUADRANT COMPARISON:  08/02/2015 FINDINGS: Gallbladder: Multiple small gallstones mobile within the gallbladder. Borderline gallbladder wall thickening. No surrounding fluid. Negative Murphy sign, the pain medicine has been administered. Common bile duct: Diameter: 5.1 mm, within normal limits Liver: No focal lesion identified. Within normal limits in parenchymal echogenicity. IMPRESSION: Numerous small stones mobile within the gallbladder. Borderline gallbladder wall thickening. Cannot assess accurately for Murphy sign given previous pain medicine minutes duration. The findings could represent early cholecystitis. Stones are of a size that could pass into the ductal system. Common duct within normal limits. Electronically Signed   By: Mark  Shogry M.D.   On: 03/13/2017 13:44    Paula Guenther, NP-C @  03/14/2017, 10:28 AM  Pager number [phone]   

## 2017-03-14 NOTE — Discharge Instructions (Signed)
Laparoscopic Cholecystectomy, Care After °This sheet gives you information about how to care for yourself after your procedure. Your health care provider may also give you more specific instructions. If you have problems or questions, contact your health care provider. °What can I expect after the procedure? °After the procedure, it is common to have: °· Pain at your incision sites. You will be given medicines to control this pain. °· Mild nausea or vomiting. °· Bloating and possible shoulder pain from the air-like gas that was used during the procedure. °Follow these instructions at home: °Incision care  ° °· Follow instructions from your health care provider about how to take care of your incisions. Make sure you: °¨ Wash your hands with soap and water before you change your bandage (dressing). If soap and water are not available, use hand sanitizer. °¨ Change your dressing as told by your health care provider. °¨ Leave stitches (sutures), skin glue, or adhesive strips in place. These skin closures may need to be in place for 2 weeks or longer. If adhesive strip edges start to loosen and curl up, you may trim the loose edges. Do not remove adhesive strips completely unless your health care provider tells you to do that. °· Do not take baths, swim, or use a hot tub until your health care provider approves. Ask your health care provider if you can take showers. You may only be allowed to take sponge baths for bathing. °· Check your incision area every day for signs of infection. Check for: °¨ More redness, swelling, or pain. °¨ More fluid or blood. °¨ Warmth. °¨ Pus or a bad smell. °Activity  °· Do not drive or use heavy machinery while taking prescription pain medicine. °· Do not lift anything that is heavier than 10 lb (4.5 kg) until your health care provider approves. °· Do not play contact sports until your health care provider approves. °· Do not drive for 24 hours if you were given a medicine to help you relax  (sedative). °· Rest as needed. Do not return to work or school until your health care provider approves. °General instructions  °· Take over-the-counter and prescription medicines only as told by your health care provider. °· To prevent or treat constipation while you are taking prescription pain medicine, your health care provider may recommend that you: °¨ Drink enough fluid to keep your urine clear or pale yellow. °¨ Take over-the-counter or prescription medicines. °¨ Eat foods that are high in fiber, such as fresh fruits and vegetables, whole grains, and beans. °¨ Limit foods that are high in fat and processed sugars, such as fried and sweet foods. °Contact a health care provider if: °· You develop a rash. °· You have more redness, swelling, or pain around your incisions. °· You have more fluid or blood coming from your incisions. °· Your incisions feel warm to the touch. °· You have pus or a bad smell coming from your incisions. °· You have a fever. °· One or more of your incisions breaks open. °Get help right away if: °· You have trouble breathing. °· You have chest pain. °· You have increasing pain in your shoulders. °· You faint or feel dizzy when you stand. °· You have severe pain in your abdomen. °· You have nausea or vomiting that lasts for more than one day. °· You have leg pain. °This information is not intended to replace advice given to you by your health care provider. Make sure you discuss any   questions you have with your health care provider. °Document Released: 12/04/2005 Document Revised: 06/24/2016 Document Reviewed: 05/22/2016 °Elsevier Interactive Patient Education © 2017 Else[employer] Inc[employer] ° °CCS ______[facility] °LAPAROSCOPIC SURGERY: POST OP INSTRUCTIONS °Always review your discharge instruction sheet given to you by the facility where your surgery was performed. °IF YOU HAVE DISABILITY OR FAMILY LEAVE FORMS, YOU MUST BRING THEM TO THE OFFICE FOR PROCESSING.   °DO NOT GIVE  THEM TO YOUR DOCTOR. ° °1. A prescription for pain medication may be given to you upon discharge.  Take your pain medication as prescribed, if needed.  If narcotic pain medicine is not needed, then you may take acetaminophen (Tylenol) or ibuprofen (Advil) as needed. °2. Take your usually prescribed medications unless otherwise directed. °3. If you need a refill on your pain medication, please contact your pharmacy.  They will contact our office to request authorization. Prescriptions will not be filled after 5pm or on week-ends. °4. You should follow a light diet the first few days after arrival home, such as soup and crackers, etc.  Be sure to include lots of fluids daily. °5. Most patients will experience some swelling and bruising in the area of the incisions.  Ice packs will help.  Swelling and bruising can take several days to resolve.  °6. It is common to experience some constipation if taking pain medication after surgery.  Increasing fluid intake and taking a stool softener (such as Colace) will usually help or prevent this problem from occurring.  A mild laxative (Milk of Magnesia or Miralax) should be taken according to package instructions if there are no bowel movements after 48 hours. °7. Unless discharge instructions indicate otherwise, you may remove your bandages 24-48 hours after surgery, and you may shower at that time.  You may have steri-strips (small skin tapes) in place directly over the incision.  These strips should be left on the skin for 7-10 days.  If your surgeon used skin glue on the incision, you may shower in 24 hours.  The glue will flake off over the next 2-3 weeks.  Any sutures or staples will be removed at the office during your follow-up visit. °8. ACTIVITIES:  You may resume regular (light) daily activities beginning the next day--such as daily self-care, walking, climbing stairs--gradually increasing activities as tolerated.  You may have sexual intercourse when it is  comfortable.  Refrain from any heavy lifting or straining until approved by your doctor. °a. You may drive when you are no longer taking prescription pain medication, you can comfortably wear a seatbelt, and you can safely maneuver your car and apply brakes. °b. RETURN TO WORK:  __________________________________________________________ °9. You should see your doctor in the office for a follow-up appointment approximately 2-3 weeks after your surgery.  Make sure that you call for this appointment within a day or two after you arrive home to insure a convenient appointment time. °10. OTHER INSTRUCTIONS: __________________________________________________________________________________________________________________________ __________________________________________________________________________________________________________________________ °WHEN TO CALL YOUR DOCTOR: °1. Fever over 101.0 °2. Inability to urinate °3. Continued bleeding from incision. °4. Increased pain, redness, or drainage from the incision. °5. Increasing abdominal pain ° °The clinic staff is available to answer your questions during regular business hours.  Please don’t hesitate to call and ask to speak to one of the nurses for clinical concerns.  If you have a medical emergency, go to the nearest emergency room or call 911.  A surgeon from [facility] is always on call at the hospital. °[address], [address], [place], [place]  [address] ? [address] [address], , [place]   [address] °[phone] ? [phone] ? FAX [phone] °Web site: [email or web address] ° °

## 2017-03-14 NOTE — Progress Notes (Signed)
Pt stated she felt jittery like her blood sugar was low. CBG checked and it was WNL. Will continue to monitor.

## 2017-03-14 NOTE — Progress Notes (Signed)
Subjective: She is comfortable on IV fluids and pain medicine.    Objective: Vital signs in last 24 hours: Temp:  [97.6 F (36.4 C)-98.9 F (37.2 C)] 98.4 F (36.9 C) (03/28 0548) Pulse Rate:  [75-86] 81 (03/28 0548) Resp:  [12-20] 16 (03/28 0548) BP: (109-147)/(67-97) 116/78 (03/28 0548) SpO2:  [97 %-100 %] 100 % (03/28 0548) Weight:  [109.2 kg (240 lb 11.9 oz)-109.8 kg (242 lb)] 109.2 kg (240 lb 11.9 oz) (03/27 2043) Last BM Date: 03/13/17 (small) NPO IV 726 recorded Urine 750 Afebrile, VSS LFT's are up   CMP Latest Ref Rng & Units 03/14/2017 03/13/2017 03/13/2017  Glucose 65 - 99 mg/dL 161(W) - 960(A)  BUN 6 - 20 mg/dL 7 - 10  Creatinine 5.40 - 1.00 mg/dL 9.81 1.91 4.78  Sodium 135 - 145 mmol/L 138 - 137  Potassium 3.5 - 5.1 mmol/L 4.1 - 3.6  Chloride 101 - 111 mmol/L 101 - 102  CO2 22 - 32 mmol/L 27 - 29  Calcium 8.9 - 10.3 mg/dL 9.3 - 9.5  Total Protein 6.5 - 8.1 g/dL 7.4 - 8.3(H)  Total Bilirubin 0.3 - 1.2 mg/dL 3.3(H) - 1.8(H)  Alkaline Phos 38 - 126 U/L 155(H) - 105  AST 15 - 41 U/L 634(H) - 250(H)  ALT 14 - 54 U/L 829(H) - 165(H)   WBC is stable  Intake/Output from previous day: 03/27 0701 - 03/28 0700 In: 726.7 [I.V.:676.7; IV Piggyback:50] Out: 750 [Urine:750] Intake/Output this shift: No intake/output data recorded.  General appearance: alert, cooperative and no distress Resp: clear to auscultation bilaterally GI: some upper abominal tenderness.  Lab Results:   Recent Labs  03/13/17 1722 03/14/17 0449  WBC 6.5 6.1  HGB 13.9 13.5  HCT 42.5 41.6  PLT 258 247    BMET  Recent Labs  03/13/17 1120 03/13/17 1722 03/14/17 0449  NA 137  --  138  K 3.6  --  4.1  CL 102  --  101  CO2 29  --  27  GLUCOSE 129*  --  137*  BUN 10  --  7  CREATININE 0.83 0.81 0.66  CALCIUM 9.5  --  9.3   PT/INR No results for input(s): LABPROT, INR in the last 72 hours.   Recent Labs Lab 03/13/17 1120 03/14/17 0449  AST 250* 634*  ALT 165* 829*   ALKPHOS 105 155*  BILITOT 1.8* 3.3*  PROT 8.3* 7.4  ALBUMIN 4.3 3.7     Lipase     Component Value Date/Time   LIPASE 20 03/14/2017 0449     Studies/Results: US Abdomen Limited Ruq  Result Date: 03/13/2017 CLINICAL DATA:  Right upper quadrant abdominal pain over the last week. EXAM: US ABDOMEN LIMITED - RIGHT UPPER QUADRANT COMPARISON:  08/02/2015 FINDINGS: Gallbladder: Multiple small gallstones mobile within the gallbladder. Borderline gallbladder wall thickening. No surrounding fluid. Negative Murphy sign, the pain medicine has been administered. Common bile duct: Diameter: 5.1 mm, within normal limits Liver: No focal lesion identified. Within normal limits in parenchymal echogenicity. IMPRESSION: Numerous small stones mobile within the gallbladder. Borderline gallbladder wall thickening. Cannot assess accurately for Murphy sign given previous pain medicine minutes duration. The findings could represent early cholecystitis. Stones are of a size that could pass into the ductal system. Common duct within normal limits. Electronically Signed   By: Paulina Fusi M.D.   On: 03/13/2017 13:44    Medications: . cefTRIAXone (ROCEPHIN)  IV  2 g Intravenous Q24H  . enoxaparin (LOVENOX) injection  40 mg Subcutaneous Q24H  . Influenza vac split quadrivalent PF  0.5 mL Intramuscular Tomorrow-1000  . pneumococcal 23 valent vaccine  0.5 mL Intramuscular Tomorrow-1000   . dextrose 5 % and 0.45 % NaCl with KCl 20 mEq/L 100 mL/hr at 03/13/17 2314   Assessment/Plan Cholelithiasis/cholecystitis/possible choledocholithiasis Rising LFT's Status post Niesen fundoplication 2005, Dr. Claud KelpHaywood Ingram History of GERD History of migraines BMI 39 FEN:  IV fluids/NPO ID: Rocephin 03/13/17 =>> day 2 DVT:  Lovenox  Plan:  She has been seen in the past by Dr. Carman ChingJames Edwards.  I have called them to evaluate her for MRCP/ERCP.  [facility] is no longer a patient at [facility],  GI is on and I am calling.   Seen by Dr.  Russella DarStark, GI service. She has an MRCP ordered.  Continue IV fluids, NPO and labs ordered for the AM.      LOS: 1 day    [name] 03/14/2017 [phone]

## 2017-03-14 NOTE — Progress Notes (Signed)
   03/14/17 1000  Clinical Encounter Type  Visited With Health care provider  Visit Type Initial;Psychological support;Spiritual support  Referral From Nurse  Consult/Referral To Chaplain  Spiritual Encounters  Spiritual Needs Prayer;Other (Comment) Zorita Pang(Priest)  Stress Factors  Patient Stress Factors Other (Comment) Zorita Pang(Priest Before Surgery )    I contacted the unit secretary who said that a [employer] [facility] was up on the unit at that time.  Follow-up is required.   Please, contact Spiritual Care for further assistance.   Chaplain Clint BolderBrittany Avika Carbine M.Div.

## 2017-03-15 DIAGNOSIS — K8000 Calculus of gallbladder with acute cholecystitis without obstruction: Secondary | ICD-10-CM

## 2017-03-15 LAB — CBC
HCT: 40.7 % (ref 36.0–46.0)
HEMOGLOBIN: 13.1 g/dL (ref 12.0–15.0)
MCH: 28.9 pg (ref 26.0–34.0)
MCHC: 32.2 g/dL (ref 30.0–36.0)
MCV: 89.6 fL (ref 78.0–100.0)
Platelets: 233 10*3/uL (ref 150–400)
RBC: 4.54 MIL/uL (ref 3.87–5.11)
RDW: 13.6 % (ref 11.5–15.5)
WBC: 4.8 10*3/uL (ref 4.0–10.5)

## 2017-03-15 LAB — LIPASE, BLOOD: LIPASE: 28 U/L (ref 11–51)

## 2017-03-15 MED ORDER — BISACODYL 10 MG RE SUPP
10.0000 mg | Freq: Every day | RECTAL | Status: DC | PRN
  Administered 2017-03-15: 10 mg via RECTAL
  Filled 2017-03-15: qty 1

## 2017-03-15 MED ORDER — POLYETHYLENE GLYCOL 3350 17 G PO PACK
17.0000 g | PACK | Freq: Two times a day (BID) | ORAL | Status: DC
  Administered 2017-03-15 – 2017-03-18 (×3): 17 g via ORAL
  Filled 2017-03-15 (×4): qty 1

## 2017-03-15 MED ORDER — POLYETHYLENE GLYCOL 3350 17 G PO PACK: 17.0000 g | PACK | Freq: Every day | ORAL | Status: DC

## 2017-03-15 NOTE — Progress Notes (Signed)
[facility] Progress Note  Chief Complaint:   Abnormal LFTs, abdominal pain  Subjective:  Still with intermittent upper abdominal pain  Objective:  Vital signs in last 24 hours: Temp:  [98.6 F (37 C)-100.4 F (38 C)] 99.3 F (37.4 C) (03/29 0532) Pulse Rate:  [71-90] 71 (03/29 0532) Resp:  [16] 16 (03/29 0532) BP: (92-125)/(49-79) 92/49 (03/29 0532) SpO2:  [99 %-100 %] 99 % (03/29 0532) Weight:  [240 lb (108.9 kg)] 240 lb (108.9 kg) (03/28 2108) Last BM Date: 03/13/17 General:   Alert, obese white female in NAD EENT:  Normal hearing, non icteric sclera, conjunctive pink.  Heart:  Regular rate and rhythm; no lower extremity edema Pulm: Normal respiratory effort Abdomen:  Soft, nondistended, nontender.  Normal bowel sounds, no masses felt. No hepatomegaly.    Neurologic:  Alert and  oriented x4;  grossly normal neurologically. Psych:  Alert and cooperative. Normal mood and affect.   Intake/Output from previous day: 03/28 0701 - 03/29 0700 In: 2450 [I.V.:2400; IV Piggyback:50] Out: 2900 [Urine:2900] Intake/Output this shift: No intake/output data recorded.  Lab Results:  Recent Labs  03/13/17 1722 03/14/17 0449 03/15/17 0459  WBC 6.5 6.1 4.8  HGB 13.9 13.5 13.1  HCT 42.5 41.6 40.7  PLT 258 247 233   BMET  Recent Labs  03/13/17 1120 03/13/17 1722 03/14/17 0449 03/14/17 1530  NA 137  --  138 138  K 3.6  --  4.1 3.9  CL 102  --  101 101  CO2 29  --  27 28  GLUCOSE 129*  --  137* 108*  BUN 10  --  7 5*  CREATININE 0.83 0.81 0.66 0.64  CALCIUM 9.5  --  9.3 9.2   LFT  Recent Labs  03/14/17 1530  PROT 8.0  ALBUMIN 4.1  AST 424*  ALT 815*  ALKPHOS 190*  BILITOT 4.4*   PT/INR  Recent Labs  03/14/17 1142  LABPROT 13.2  INR 1.00   Hepatitis Panel No results for input(s): HEPBSAG, HCVAB, HEPAIGM, HEPBIGM in the last 72 hours.  Mr 3d Recon At Scanner  Result Date: 03/15/2017 CLINICAL DATA:  Mid upper abdominal pain with  nausea and vomiting for 12 days. Hi elevated liver function tests. EXAM: MRI ABDOMEN WITHOUT AND WITH CONTRAST (INCLUDING MRCP) TECHNIQUE: Multiplanar multisequence MR imaging of the abdomen was performed both before and after the administration of intravenous contrast. Heavily T2-weighted images of the biliary and pancreatic ducts were obtained, and three-dimensional MRCP images were rendered by post processing. CONTRAST:  20mL MULTIHANCE GADOBENATE DIMEGLUMINE 529 MG/ML IV SOLN COMPARISON:  03/13/2017 ultrasound.  03/13/2008 abdominopelvic CT. FINDINGS: Lower chest: Normal heart size without pericardial or pleural effusion. Hepatobiliary: Mildly decreased signal on out of phase imaging throughout the liver, suggesting mild hepatic steatosis. Small gallstones, without gallbladder wall thickening or pericholecystic edema/ fluid. There is nonspecific periportal edema, including on images 17 and 19/series 4. No intrahepatic biliary duct dilatation. The common duct is upper normal for patient age, including at 7 mm on image 60/series 6. No choledocholithiasis. Pancreas:  Normal, without mass or ductal dilatation. Spleen:  Normal in size, without focal abnormality. Adrenals/Urinary Tract: Normal adrenal glands. Normal kidneys, without hydronephrosis. Stomach/Bowel: Tiny hiatal hernia.  Normal abdominal bowel loops. Vascular/Lymphatic: Normal caliber of the aorta and branch vessels. Borderline enlarged portacaval node, including at 13 mm on image 26/ series 4. Likely reactive. Other:  No ascites. Musculoskeletal: No acute osseous abnormality. IMPRESSION: 1. Cholelithiasis without acute cholecystitis. 2.  Upper normal common duct size, without choledocholithiasis. 3. Mild hepatic steatosis. Nonspecific periportal edema with borderline enlarged portacaval node. Recommend clinical exclusion of hepatitis, which could have this appearance. Electronically Signed   By: Jeronimo Greaves M.D.   On: 03/15/2017 08:12   Mr Abdomen Mrcp  Vivien Rossetti Contast  Result Date: 03/15/2017 CLINICAL DATA:  Mid upper abdominal pain with nausea and vomiting for 12 days. Hi elevated liver function tests. EXAM: MRI ABDOMEN WITHOUT AND WITH CONTRAST (INCLUDING MRCP) TECHNIQUE: Multiplanar multisequence MR imaging of the abdomen was performed both before and after the administration of intravenous contrast. Heavily T2-weighted images of the biliary and pancreatic ducts were obtained, and three-dimensional MRCP images were rendered by post processing. CONTRAST:  20mL MULTIHANCE GADOBENATE DIMEGLUMINE 529 MG/ML IV SOLN COMPARISON:  03/13/2017 ultrasound.  03/13/2008 abdominopelvic CT. FINDINGS: Lower chest: Normal heart size without pericardial or pleural effusion. Hepatobiliary: Mildly decreased signal on out of phase imaging throughout the liver, suggesting mild hepatic steatosis. Small gallstones, without gallbladder wall thickening or pericholecystic edema/ fluid. There is nonspecific periportal edema, including on images 17 and 19/series 4. No intrahepatic biliary duct dilatation. The common duct is upper normal for patient age, including at 7 mm on image 60/series 6. No choledocholithiasis. Pancreas:  Normal, without mass or ductal dilatation. Spleen:  Normal in size, without focal abnormality. Adrenals/Urinary Tract: Normal adrenal glands. Normal kidneys, without hydronephrosis. Stomach/Bowel: Tiny hiatal hernia.  Normal abdominal bowel loops. Vascular/Lymphatic: Normal caliber of the aorta and branch vessels. Borderline enlarged portacaval node, including at 13 mm on image 26/ series 4. Likely reactive. Other:  No ascites. Musculoskeletal: No acute osseous abnormality. IMPRESSION: 1. Cholelithiasis without acute cholecystitis. 2. Upper normal common duct size, without choledocholithiasis. 3. Mild hepatic steatosis. Nonspecific periportal edema with borderline enlarged portacaval node. Recommend clinical exclusion of hepatitis, which could have this appearance.  Electronically Signed   By: Jeronimo Greaves M.D.   On: 03/15/2017 08:12   US Abdomen Limited Ruq  Result Date: 03/13/2017 CLINICAL DATA:  Right upper quadrant abdominal pain over the last week. EXAM: US ABDOMEN LIMITED - RIGHT UPPER QUADRANT COMPARISON:  08/02/2015 FINDINGS: Gallbladder: Multiple small gallstones mobile within the gallbladder. Borderline gallbladder wall thickening. No surrounding fluid. Negative Murphy sign, the pain medicine has been administered. Common bile duct: Diameter: 5.1 mm, within normal limits Liver: No focal lesion identified. Within normal limits in parenchymal echogenicity. IMPRESSION: Numerous small stones mobile within the gallbladder. Borderline gallbladder wall thickening. Cannot assess accurately for Murphy sign given previous pain medicine minutes duration. The findings could represent early cholecystitis. Stones are of a size that could pass into the ductal system. Common duct within normal limits. Electronically Signed   By: Paulina Fusi M.D.   On: 03/13/2017 13:44    Assessment / Plan:  1. 36 yo female with radiating upper abdominal pain, cholelithiasis and abnormal LFTs. Transaminases improving but slightly more cholestatic today. MRCP revealed a CBD with was upper limits of normal. No stones seen in duct.  -Recommend proceeding with lap chole with IOC.   2. Steatosis. MRCP also shows some periportal edema with borderline portacaval node. If LFTs don't normalize after acute issues then will need additional evaluation of liver.   3. Acute on chronic constipation.  -dulcolax supp prn -If no surgery today then should be able to eat and will give Miralax .  Active Problems:   Cholelithiasis with cholecystitis    LOS: 2 days   Willette Cluster NP 03/15/2017, 9:04 AM Pager number [phone]  Attending physician's note   I have taken an interval history, reviewed the chart and examined the patient. I agree with the Advanced Practitioner's note,  impression and recommendations. MRCP shows CBD at upper limits of normal, no CBD stones, periportal edema, borderline portacaval node. Miralax for constipation. Recommend proceeding with cholecystectomy with IOC. Trend LFTs.   Claudette HeadMalcolm Garold Sheeler, MD Clementeen GrahamFACG [phone] Mon-Fri 8a-5p [phone] after 5p, weekends, holidays

## 2017-03-15 NOTE — Progress Notes (Signed)
Subjective: No real change continues to have pain midepigastric area. No nausea or vomiting.  Objective: Vital signs in last 24 hours: Temp:  [98.6 F (37 C)-100.4 F (38 C)] 99.3 F (37.4 C) (03/29 0532) Pulse Rate:  [71-90] 71 (03/29 0532) Resp:  [16] 16 (03/29 0532) BP: (92-125)/(49-79) 92/49 (03/29 0532) SpO2:  [99 %-100 %] 99 % (03/29 0532) Weight:  [108.9 kg (240 lb)] 108.9 kg (240 lb) (03/28 2108) Last BM Date: 03/13/17 NPO 2400 IV 2900 urine TM 100.4 BP down some this Am 0300 Bilirubin up to 4.4, ALT 815, AST 424 CBC was normal. MRCP completed this a.m. at 9:32 AM: Reports cholelithiasis without acute cholecystitis. Common bile duct was the upper limits of normal no stones were seen. Mild hepatic steatosis, nonspecific periportal edema with borderline enlarged portacaval node question of hepatitis.  Intake/Output from previous day: 03/28 0701 - 03/29 0700 In: 2450 [I.V.:2400; IV Piggyback:50] Out: 2900 [Urine:2900] Intake/Output this shift: No intake/output data recorded.  General appearance: alert, cooperative and no distress Resp: clear to auscultation bilaterally GI: Soft, she is more sore and a little tender mid abdomen. No acute findings.  Lab Results:   Recent Labs  03/14/17 0449 03/15/17 0459  WBC 6.1 4.8  HGB 13.5 13.1  HCT 41.6 40.7  PLT 247 233    BMET  Recent Labs  03/14/17 0449 03/14/17 1530  NA 138 138  K 4.1 3.9  CL 101 101  CO2 27 28  GLUCOSE 137* 108*  BUN 7 5*  CREATININE 0.66 0.64  CALCIUM 9.3 9.2   PT/INR  Recent Labs  03/14/17 1142  LABPROT 13.2  INR 1.00     Recent Labs Lab 03/13/17 1120 03/14/17 0449 03/14/17 1530  AST 250* 634* 424*  ALT 165* 829* 815*  ALKPHOS 105 155* 190*  BILITOT 1.8* 3.3* 4.4*  PROT 8.3* 7.4 8.0  ALBUMIN 4.3 3.7 4.1   CMP Latest Ref Rng & Units 03/14/2017 03/14/2017 03/13/2017  Glucose 65 - 99 mg/dL 409(W108(H) 119(J137(H) -  BUN 6 - 20 mg/dL 5(L) 7 -  Creatinine 4.780.44 - 1.00 mg/dL 2.950.64  6.210.66 3.080.81  Sodium 135 - 145 mmol/L 138 138 -  Potassium 3.5 - 5.1 mmol/L 3.9 4.1 -  Chloride 101 - 111 mmol/L 101 101 -  CO2 22 - 32 mmol/L 28 27 -  Calcium 8.9 - 10.3 mg/dL 9.2 9.3 -  Total Protein 6.5 - 8.1 g/dL 8.0 7.4 -  Total Bilirubin 0.3 - 1.2 mg/dL 4.4(H) 3.3(H) -  Alkaline Phos 38 - 126 U/L 190(H) 155(H) -  AST 15 - 41 U/L 424(H) 634(H) -  ALT 14 - 54 U/L 815(H) 829(H) -     Lipase     Component Value Date/Time   LIPASE 28 03/15/2017 0459     Studies/Results: Mr 3d Recon At Scanner  Result Date: 03/15/2017 CLINICAL DATA:  Mid upper abdominal pain with nausea and vomiting for 12 days. Hi elevated liver function tests. EXAM: MRI ABDOMEN WITHOUT AND WITH CONTRAST (INCLUDING MRCP) TECHNIQUE: Multiplanar multisequence MR imaging of the abdomen was performed both before and after the administration of intravenous contrast. Heavily T2-weighted images of the biliary and pancreatic ducts were obtained, and three-dimensional MRCP images were rendered by post processing. CONTRAST:  20mL MULTIHANCE GADOBENATE DIMEGLUMINE 529 MG/ML IV SOLN COMPARISON:  03/13/2017 ultrasound.  03/13/2008 abdominopelvic CT. FINDINGS: Lower chest: Normal heart size without pericardial or pleural effusion. Hepatobiliary: Mildly decreased signal on out of phase imaging throughout the liver, suggesting mild  hepatic steatosis. Small gallstones, without gallbladder wall thickening or pericholecystic edema/ fluid. There is nonspecific periportal edema, including on images 17 and 19/series 4. No intrahepatic biliary duct dilatation. The common duct is upper normal for patient age, including at 7 mm on image 60/series 6. No choledocholithiasis. Pancreas:  Normal, without mass or ductal dilatation. Spleen:  Normal in size, without focal abnormality. Adrenals/Urinary Tract: Normal adrenal glands. Normal kidneys, without hydronephrosis. Stomach/Bowel: Tiny hiatal hernia.  Normal abdominal bowel loops. Vascular/Lymphatic: Normal  caliber of the aorta and branch vessels. Borderline enlarged portacaval node, including at 13 mm on image 26/ series 4. Likely reactive. Other:  No ascites. Musculoskeletal: No acute osseous abnormality. IMPRESSION: 1. Cholelithiasis without acute cholecystitis. 2. Upper normal common duct size, without choledocholithiasis. 3. Mild hepatic steatosis. Nonspecific periportal edema with borderline enlarged portacaval node. Recommend clinical exclusion of hepatitis, which could have this appearance. Electronically Signed   By: Jeronimo Greaves M.D.   On: 03/15/2017 08:12   Mr Abdomen Mrcp Vivien Rossetti Contast  Result Date: 03/15/2017 CLINICAL DATA:  Mid upper abdominal pain with nausea and vomiting for 12 days. Hi elevated liver function tests. EXAM: MRI ABDOMEN WITHOUT AND WITH CONTRAST (INCLUDING MRCP) TECHNIQUE: Multiplanar multisequence MR imaging of the abdomen was performed both before and after the administration of intravenous contrast. Heavily T2-weighted images of the biliary and pancreatic ducts were obtained, and three-dimensional MRCP images were rendered by post processing. CONTRAST:  20mL MULTIHANCE GADOBENATE DIMEGLUMINE 529 MG/ML IV SOLN COMPARISON:  03/13/2017 ultrasound.  03/13/2008 abdominopelvic CT. FINDINGS: Lower chest: Normal heart size without pericardial or pleural effusion. Hepatobiliary: Mildly decreased signal on out of phase imaging throughout the liver, suggesting mild hepatic steatosis. Small gallstones, without gallbladder wall thickening or pericholecystic edema/ fluid. There is nonspecific periportal edema, including on images 17 and 19/series 4. No intrahepatic biliary duct dilatation. The common duct is upper normal for patient age, including at 7 mm on image 60/series 6. No choledocholithiasis. Pancreas:  Normal, without mass or ductal dilatation. Spleen:  Normal in size, without focal abnormality. Adrenals/Urinary Tract: Normal adrenal glands. Normal kidneys, without hydronephrosis.  Stomach/Bowel: Tiny hiatal hernia.  Normal abdominal bowel loops. Vascular/Lymphatic: Normal caliber of the aorta and branch vessels. Borderline enlarged portacaval node, including at 13 mm on image 26/ series 4. Likely reactive. Other:  No ascites. Musculoskeletal: No acute osseous abnormality. IMPRESSION: 1. Cholelithiasis without acute cholecystitis. 2. Upper normal common duct size, without choledocholithiasis. 3. Mild hepatic steatosis. Nonspecific periportal edema with borderline enlarged portacaval node. Recommend clinical exclusion of hepatitis, which could have this appearance. Electronically Signed   By: Jeronimo Greaves M.D.   On: 03/15/2017 08:12   US Abdomen Limited Ruq  Result Date: 03/13/2017 CLINICAL DATA:  Right upper quadrant abdominal pain over the last week. EXAM: US ABDOMEN LIMITED - RIGHT UPPER QUADRANT COMPARISON:  08/02/2015 FINDINGS: Gallbladder: Multiple small gallstones mobile within the gallbladder. Borderline gallbladder wall thickening. No surrounding fluid. Negative Murphy sign, the pain medicine has been administered. Common bile duct: Diameter: 5.1 mm, within normal limits Liver: No focal lesion identified. Within normal limits in parenchymal echogenicity. IMPRESSION: Numerous small stones mobile within the gallbladder. Borderline gallbladder wall thickening. Cannot assess accurately for Murphy sign given previous pain medicine minutes duration. The findings could represent early cholecystitis. Stones are of a size that could pass into the ductal system. Common duct within normal limits. Electronically Signed   By: Paulina Fusi M.D.   On: 03/13/2017 13:44    Medications: . cefTRIAXone (ROCEPHIN)  IV  2 g Intravenous Q24H  . enoxaparin (LOVENOX) injection  40 mg Subcutaneous Q24H  . Influenza vac split quadrivalent PF  0.5 mL Intramuscular Tomorrow-1000  . pneumococcal 23 valent vaccine  0.5 mL Intramuscular Tomorrow-1000   . dextrose 5 % and 0.45 % NaCl with KCl 20 mEq/L 100  mL/hr at 03/14/17 2025    Assessment/Plan Cholelithiasis/cholecystitis/possible choledocholithiasis Rising LFT's - bilirubin up to 4.4 today; 1.8 2 days ago at time of admission. Status post Niesen fundoplication 2005, Dr. Claud Kelp History of GERD History of migraines BMI 39 FEN:  IV fluids/NPO ID: Rocephin 03/13/17 =>> day 3 DVT:  Lovenox  Plan: Dr. Russella Dar recommends laparoscopic cholecystectomy with IOC and they will continue to follow. We'll review with Dr. Donell Beers.   LOS: 2 days    Saraiya Kozma 03/15/2017 [phone]

## 2017-03-16 ENCOUNTER — Inpatient Hospital Stay (HOSPITAL_COMMUNITY): Payer: 59 | Admitting: Anesthesiology

## 2017-03-16 ENCOUNTER — Inpatient Hospital Stay (HOSPITAL_COMMUNITY): Payer: 59

## 2017-03-16 ENCOUNTER — Encounter (HOSPITAL_COMMUNITY)
Admission: EM | Disposition: A | Discharge status: Home / Self Care | Payer: Self-pay | Source: Home / Self Care | Attending: Surgery

## 2017-03-16 ENCOUNTER — Encounter (HOSPITAL_COMMUNITY): Payer: Self-pay | Admitting: Anesthesiology

## 2017-03-16 HISTORY — PX: CHOLECYSTECTOMY: SHX55

## 2017-03-16 LAB — CBC
HCT: 43.6 % (ref 36.0–46.0)
Hemoglobin: 14 g/dL (ref 12.0–15.0)
MCH: 28.9 pg (ref 26.0–34.0)
MCHC: 32.1 g/dL (ref 30.0–36.0)
MCV: 90.1 fL (ref 78.0–100.0)
PLATELETS: 218 10*3/uL (ref 150–400)
RBC: 4.84 MIL/uL (ref 3.87–5.11)
RDW: 13.6 % (ref 11.5–15.5)
WBC: 7.7 10*3/uL (ref 4.0–10.5)

## 2017-03-16 LAB — CREATININE, SERUM: CREATININE: 0.74 mg/dL (ref 0.44–1.00)

## 2017-03-16 SURGERY — LAPAROSCOPIC CHOLECYSTECTOMY WITH INTRAOPERATIVE CHOLANGIOGRAM
Anesthesia: General

## 2017-03-16 MED ORDER — ONDANSETRON 4 MG PO TBDP: 4.0000 mg | ORAL_TABLET | Freq: Four times a day (QID) | ORAL | Status: DC | PRN

## 2017-03-16 MED ORDER — OXYCODONE HCL 5 MG/5ML PO SOLN
5.0000 mg | Freq: Once | ORAL | Status: DC | PRN
  Filled 2017-03-16: qty 5

## 2017-03-16 MED ORDER — ROCURONIUM BROMIDE 50 MG/5ML IV SOSY
PREFILLED_SYRINGE | INTRAVENOUS | Status: AC
  Filled 2017-03-16: qty 5

## 2017-03-16 MED ORDER — ONDANSETRON HCL 4 MG/2ML IJ SOLN: 4.0000 mg | Freq: Four times a day (QID) | INTRAMUSCULAR | Status: DC | PRN

## 2017-03-16 MED ORDER — LACTATED RINGERS IV SOLN
INTRAVENOUS | Status: DC | PRN
  Administered 2017-03-16 (×2): via INTRAVENOUS

## 2017-03-16 MED ORDER — SODIUM CHLORIDE 0.9 % IJ SOLN
INTRAMUSCULAR | Status: AC
  Filled 2017-03-16: qty 50

## 2017-03-16 MED ORDER — FENTANYL CITRATE (PF) 100 MCG/2ML IJ SOLN
INTRAMUSCULAR | Status: AC
  Filled 2017-03-16: qty 2

## 2017-03-16 MED ORDER — ACETAMINOPHEN 500 MG PO TABS: 1000.0000 mg | ORAL_TABLET | Freq: Four times a day (QID) | ORAL | Status: DC | PRN

## 2017-03-16 MED ORDER — BUPIVACAINE HCL (PF) 0.25 % IJ SOLN
INTRAMUSCULAR | Status: AC
  Filled 2017-03-16: qty 30

## 2017-03-16 MED ORDER — BUPIVACAINE LIPOSOME 1.3 % IJ SUSP
20.0000 mL | Freq: Once | INTRAMUSCULAR | Status: AC
  Administered 2017-03-16: 20 mL
  Filled 2017-03-16: qty 20

## 2017-03-16 MED ORDER — PROPOFOL 10 MG/ML IV BOLUS
INTRAVENOUS | Status: AC
  Filled 2017-03-16: qty 20

## 2017-03-16 MED ORDER — LIDOCAINE 2% (20 MG/ML) 5 ML SYRINGE
INTRAMUSCULAR | Status: AC
  Filled 2017-03-16: qty 5

## 2017-03-16 MED ORDER — SUGAMMADEX SODIUM 200 MG/2ML IV SOLN
INTRAVENOUS | Status: AC
  Filled 2017-03-16: qty 2

## 2017-03-16 MED ORDER — DEXAMETHASONE SODIUM PHOSPHATE 10 MG/ML IJ SOLN
INTRAMUSCULAR | Status: DC | PRN
  Administered 2017-03-16: 10 mg via INTRAVENOUS

## 2017-03-16 MED ORDER — KCL IN DEXTROSE-NACL 20-5-0.45 MEQ/L-%-% IV SOLN
INTRAVENOUS | Status: DC
  Administered 2017-03-16: 14:00:00 via INTRAVENOUS
  Administered 2017-03-17: 100 mL/h via INTRAVENOUS
  Administered 2017-03-18: 01:00:00 via INTRAVENOUS
  Filled 2017-03-16 (×5): qty 1000

## 2017-03-16 MED ORDER — FENTANYL CITRATE (PF) 250 MCG/5ML IJ SOLN
INTRAMUSCULAR | Status: AC
  Filled 2017-03-16: qty 5

## 2017-03-16 MED ORDER — 0.9 % SODIUM CHLORIDE (POUR BTL) OPTIME
TOPICAL | Status: DC | PRN
  Administered 2017-03-16: 1000 mL

## 2017-03-16 MED ORDER — MIDAZOLAM HCL 2 MG/2ML IJ SOLN
INTRAMUSCULAR | Status: AC
  Filled 2017-03-16: qty 2

## 2017-03-16 MED ORDER — FENTANYL CITRATE (PF) 100 MCG/2ML IJ SOLN
25.0000 ug | INTRAMUSCULAR | Status: DC | PRN
  Administered 2017-03-16 (×3): 50 ug via INTRAVENOUS

## 2017-03-16 MED ORDER — DEXAMETHASONE SODIUM PHOSPHATE 10 MG/ML IJ SOLN
INTRAMUSCULAR | Status: AC
  Filled 2017-03-16: qty 1

## 2017-03-16 MED ORDER — OXYCODONE HCL 5 MG PO TABS: 5.0000 mg | ORAL_TABLET | Freq: Once | ORAL | Status: DC | PRN

## 2017-03-16 MED ORDER — OXYCODONE-ACETAMINOPHEN 5-325 MG PO TABS
1.0000 | ORAL_TABLET | ORAL | Status: DC | PRN
  Administered 2017-03-17: 2 via ORAL
  Filled 2017-03-16: qty 2

## 2017-03-16 MED ORDER — MIDAZOLAM HCL 5 MG/5ML IJ SOLN
INTRAMUSCULAR | Status: DC | PRN
  Administered 2017-03-16: 2 mg via INTRAVENOUS

## 2017-03-16 MED ORDER — IOPAMIDOL (ISOVUE-300) INJECTION 61%
INTRAVENOUS | Status: AC
  Filled 2017-03-16: qty 50

## 2017-03-16 MED ORDER — PROPOFOL 10 MG/ML IV BOLUS
INTRAVENOUS | Status: DC | PRN
  Administered 2017-03-16: 160 mg via INTRAVENOUS

## 2017-03-16 MED ORDER — ROCURONIUM BROMIDE 100 MG/10ML IV SOLN
INTRAVENOUS | Status: DC | PRN
  Administered 2017-03-16: 10 mg via INTRAVENOUS
  Administered 2017-03-16: 50 mg via INTRAVENOUS
  Administered 2017-03-16: 10 mg via INTRAVENOUS

## 2017-03-16 MED ORDER — LACTATED RINGERS IR SOLN
Status: DC | PRN
  Administered 2017-03-16: 600 mL

## 2017-03-16 MED ORDER — ONDANSETRON HCL 4 MG/2ML IJ SOLN
INTRAMUSCULAR | Status: AC
  Filled 2017-03-16: qty 2

## 2017-03-16 MED ORDER — IOPAMIDOL (ISOVUE-300) INJECTION 61%
INTRAVENOUS | Status: DC | PRN
  Administered 2017-03-16: 17 mL

## 2017-03-16 MED ORDER — SUGAMMADEX SODIUM 200 MG/2ML IV SOLN
INTRAVENOUS | Status: DC | PRN
  Administered 2017-03-16: 200 mg via INTRAVENOUS

## 2017-03-16 MED ORDER — FENTANYL CITRATE (PF) 100 MCG/2ML IJ SOLN
INTRAMUSCULAR | Status: DC | PRN
  Administered 2017-03-16 (×2): 50 ug via INTRAVENOUS
  Administered 2017-03-16: 100 ug via INTRAVENOUS
  Administered 2017-03-16: 50 ug via INTRAVENOUS

## 2017-03-16 MED ORDER — HEPARIN SODIUM (PORCINE) 5000 UNIT/ML IJ SOLN
5000.0000 [IU] | Freq: Three times a day (TID) | INTRAMUSCULAR | Status: AC
  Administered 2017-03-16 (×2): 5000 [IU] via SUBCUTANEOUS
  Filled 2017-03-16 (×2): qty 1

## 2017-03-16 MED ORDER — LIDOCAINE HCL (CARDIAC) 20 MG/ML IV SOLN
INTRAVENOUS | Status: DC | PRN
  Administered 2017-03-16: 60 mg via INTRAVENOUS

## 2017-03-16 SURGICAL SUPPLY — 40 items
ADH SKN CLS APL DERMABOND .7 (GAUZE/BANDAGES/DRESSINGS) ×1
APPLIER CLIP 5 13 M/L LIGAMAX5 (MISCELLANEOUS) ×3
APPLIER CLIP ROT 10 11.4 M/L (STAPLE) ×3
APR CLP MED LRG 11.4X10 (STAPLE) ×1
APR CLP MED LRG 5 ANG JAW (MISCELLANEOUS) ×1
BAG SPEC RTRVL LRG 6X4 10 (ENDOMECHANICALS) ×1
CABLE HIGH FREQUENCY MONO STRZ (ELECTRODE) ×3 IMPLANT
CATH REDDICK CHOLANGI 4FR 50CM (CATHETERS) ×2 IMPLANT
CHLORAPREP W/TINT 26ML (MISCELLANEOUS) ×3 IMPLANT
CLIP APPLIE 5 13 M/L LIGAMAX5 (MISCELLANEOUS) ×1 IMPLANT
CLIP APPLIE ROT 10 11.4 M/L (STAPLE) IMPLANT
COVER MAYO STAND STRL (DRAPES) ×3 IMPLANT
COVER SURGICAL LIGHT HANDLE (MISCELLANEOUS) ×3 IMPLANT
DERMABOND ADVANCED (GAUZE/BANDAGES/DRESSINGS) ×2
DERMABOND ADVANCED .7 DNX12 (GAUZE/BANDAGES/DRESSINGS) ×1 IMPLANT
DRAPE C-ARM 42X120 X-RAY (DRAPES) ×3 IMPLANT
ELECT REM PT RETURN 15FT ADLT (MISCELLANEOUS) ×3 IMPLANT
GLOVE BIO SURGEON STRL SZ 6.5 (GLOVE) ×1 IMPLANT
GLOVE BIO SURGEONS STRL SZ 6.5 (GLOVE)
GLOVE BIOGEL PI IND STRL 7.0 (GLOVE) ×1 IMPLANT
GLOVE BIOGEL PI INDICATOR 7.0 (GLOVE)
GOWN STRL REUS W/TWL 2XL LVL3 (GOWN DISPOSABLE) ×3 IMPLANT
GOWN STRL REUS W/TWL XL LVL3 (GOWN DISPOSABLE) ×6 IMPLANT
IRRIG SUCT STRYKERFLOW 2 WTIP (MISCELLANEOUS) ×3
IRRIGATION SUCT STRKRFLW 2 WTP (MISCELLANEOUS) ×1 IMPLANT
IV CATH 14GX2 1/4 (CATHETERS) ×3 IMPLANT
KIT BASIN OR (CUSTOM PROCEDURE TRAY) ×3 IMPLANT
POUCH SPECIMEN RETRIEVAL 10MM (ENDOMECHANICALS) ×3 IMPLANT
SCISSORS LAP 5X35 DISP (ENDOMECHANICALS) ×3 IMPLANT
SET CHOLANGIOGRAPH MIX (MISCELLANEOUS) ×3 IMPLANT
SUT MNCRL AB 4-0 PS2 18 (SUTURE) ×4 IMPLANT
SUT VIC AB 2-0 SH 27 (SUTURE)
SUT VIC AB 2-0 SH 27X BRD (SUTURE) ×1 IMPLANT
SUT VIC AB 4-0 PS2 18 (SUTURE) ×1 IMPLANT
TOWEL OR 17X26 10 PK STRL BLUE (TOWEL DISPOSABLE) ×3 IMPLANT
TOWEL OR NON WOVEN STRL DISP B (DISPOSABLE) ×1 IMPLANT
TRAY LAPAROSCOPIC (CUSTOM PROCEDURE TRAY) ×3 IMPLANT
TROCAR BLADELESS OPT 12M 100M (ENDOMECHANICALS) IMPLANT
TROCAR XCEL BLUNT TIP 100MML (ENDOMECHANICALS) ×3 IMPLANT
TUBING INSUF HEATED (TUBING) ×3 IMPLANT

## 2017-03-16 NOTE — Progress Notes (Addendum)
Intraoperative cholangiogram report and images were personally reviewed: no obvious bile duct stones but no flow into duodenum and CBD is mildly dilated. Lap chole op note reviewed. Case discussed with Dr. Daphine Deutscher.  ERCP indicated for suspected CBD stone(s) causing CBD obstruction leading to IOC findings and elevated LFTs/t bili. I spoke with patient, explained the risks and benefits of ERCP with sphincterotomy. She consents to proceed. ERCP currently scheduled for 0730 tomorrow. Clears for now and NPO past MN.

## 2017-03-16 NOTE — Interval H&P Note (Signed)
History and Physical Interval Note:  03/16/2017 7:51 AM  [name]  has presented today for surgery, with the diagnosis of cholelithiasis  The various methods of treatment have been discussed with the patient and family. After consideration of risks, benefits and other options for treatment, the patient has consented to  Procedure(s): LAPAROSCOPIC CHOLECYSTECTOMY WITH INTRAOPERATIVE CHOLANGIOGRAM (N/A) as a surgical intervention .  The patient's history has been reviewed, patient examined, no change in status, stable for surgery.  I have reviewed the patient's chart and labs.  Questions were answered to the patient's satisfaction.     Turon Kilmer B

## 2017-03-16 NOTE — Op Note (Signed)
[name]   03/16/2017  9:34 AM  Procedure: Laparoscopic Cholecystectomy with intraoperative cholangiogram  Surgeon: Susy Frizzle B. Daphine Deutscher, MD, FACS Asst:  none  Anes:  General  Drains:  None  Findings: Acute cholecystitis; small cholesterol stones; IOC ->does not empty into the duodenum even after delay imaging  Description of Procedure: The patient was taken to OR  1 and given general anesthesia.  The patient was prepped with Technicare and draped sterilely. A time out was performed.  Access to the abdomen was achieved with a 5 mm Optiview through the right upper quadrant.  Port placement included three 5 mm trocars and a 11 in the upper mid abdomen.    The gallbladder was visualized and the fundus punctured and drained and  was grasped and the gallbladder was elevated. Traction on the infundibulum allowed for successful demonstration of the critical view. Inflammatory changes were acute and adhesions and dissected occurred to reveal the critical view.  The cystic duct was identified and clipped up on the gallbladder and an incision was made in the cystic duct and the Reddick catheter was inserted after milking the cystic duct of any debris. A dynamic cholangiogram was performed which demonstrated intrahepatic filling and no flow into the duodenum despite delayed images.    The cystic duct was attenuated and clipped as it spontaneously divided and so the 10 mm stapler was used to further secure the cystic  stump, the cystic artery was double clipped and divided and then the gallbladder was removed from the gallbladder bed. Removal of the gallbladder from the gallbladder bed was performed with entering it.  The gallbladder was then placed in a bag and brought out through one of the trocar sites. The gallbladder bed was inspected and no bleeding or bile leaks were seen.      Incisions were injected with Exparel and closed with 4-0 Monocryl and Dermabond on the skin.  Sponge and needle count were  correct.    The patient was taken to the recovery room in satisfactory condition.

## 2017-03-16 NOTE — Anesthesia Procedure Notes (Signed)
Procedure Name: Intubation Date/Time: 03/16/2017 8:05 AM Performed by: Thornell Mule Pre-anesthesia Checklist: Patient identified, Emergency Drugs available, Suction available and Patient being monitored Patient Re-evaluated:Patient Re-evaluated prior to inductionOxygen Delivery Method: Circle system utilized Preoxygenation: Pre-oxygenation with 100% oxygen Intubation Type: IV induction Ventilation: Mask ventilation without difficulty Laryngoscope Size: Miller and 3 Grade View: Grade I Tube type: Oral Tube size: 7.5 mm Number of attempts: 1 Airway Equipment and Method: Stylet and Oral airway Placement Confirmation: ETT inserted through vocal cords under direct vision,  positive ETCO2 and breath sounds checked- equal and bilateral Secured at: 20 cm Tube secured with: Tape Dental Injury: Teeth and Oropharynx as per pre-operative assessment

## 2017-03-16 NOTE — Anesthesia Preprocedure Evaluation (Addendum)
Anesthesia Evaluation  Patient identified by MRN, date of birth, ID band Patient awake    Reviewed: Allergy & Precautions, NPO status , Patient's Chart, lab work & pertinent test results  History of Anesthesia Complications Negative for: history of anesthetic complications  Airway Mallampati: II  TM Distance: >3 FB Neck ROM: Full    Dental  (+) Teeth Intact   Pulmonary asthma ,    breath sounds clear to auscultation       Cardiovascular negative cardio ROS   Rhythm:Regular     Neuro/Psych  Headaches, Depression    GI/Hepatic Neg liver ROS, GERD  ,Cholelithiasis    Endo/Other  Morbid obesity  Renal/GU negative Renal ROS     Musculoskeletal negative musculoskeletal ROS (+)   Abdominal   Peds  Hematology negative hematology ROS (+)   Anesthesia Other Findings   Reproductive/Obstetrics                             Anesthesia Physical Anesthesia Plan  ASA: II  Anesthesia Plan: General   Post-op Pain Management:    Induction: Intravenous  Airway Management Planned: Oral ETT  Additional Equipment: None  Intra-op Plan:   Post-operative Plan: Extubation in OR  Informed Consent: I have reviewed the patients History and Physical, chart, labs and discussed the procedure including the risks, benefits and alternatives for the proposed anesthesia with the patient or authorized representative who has indicated his/her understanding and acceptance.   Dental advisory given  Plan Discussed with: CRNA and Surgeon  Anesthesia Plan Comments:         Anesthesia Quick Evaluation

## 2017-03-16 NOTE — Anesthesia Postprocedure Evaluation (Addendum)
Anesthesia Post Note  Patient: [name]  Procedure(s) Performed: Procedure(s) (LRB): LAPAROSCOPIC CHOLECYSTECTOMY WITH INTRAOPERATIVE CHOLANGIOGRAM (N/A)  Patient location during evaluation: PACU Anesthesia Type: General Level of consciousness: awake and patient cooperative Pain management: pain level controlled Vital Signs Assessment: post-procedure vital signs reviewed and stable Respiratory status: spontaneous breathing, nonlabored ventilation, respiratory function stable and patient connected to nasal cannula oxygen Cardiovascular status: blood pressure returned to baseline and stable Postop Assessment: no signs of nausea or vomiting Anesthetic complications: no       Last Vitals:  Vitals:   03/16/17 1300 03/16/17 1400  BP: (!) 147/93 (!) 141/89  Pulse: 91 (!) 102  Resp: 16 16  Temp: 36.9 C 36.9 C    Last Pain:  Vitals:   03/16/17 1400  TempSrc: Axillary  PainSc:                  Sonu Kruckenberg

## 2017-03-16 NOTE — Transfer of Care (Signed)
Immediate Anesthesia Transfer of Care Note  Patient: [name]  Procedure(s) Performed: Procedure(s): LAPAROSCOPIC CHOLECYSTECTOMY WITH INTRAOPERATIVE CHOLANGIOGRAM (N/A)  Patient Location: PACU  Anesthesia Type:General  Level of Consciousness: awake, alert  and oriented  Airway & Oxygen Therapy: Patient Spontanous Breathing and Patient connected to face mask oxygen  Post-op Assessment: Report given to RN and Post -op Vital signs reviewed and stable  Post vital signs: Reviewed and stable  Last Vitals:  Vitals:   03/15/17 2138 03/16/17 0612  BP: 129/82 110/66  Pulse: 86 78  Resp: 16 16  Temp: 37.5 C 37.2 C    Last Pain:  Vitals:   03/16/17 0740  TempSrc:   PainSc: 5       Patients Stated Pain Goal: 2 (03/16/17 0740)  Complications: No apparent anesthesia complications

## 2017-03-17 ENCOUNTER — Encounter (HOSPITAL_COMMUNITY): Payer: Self-pay | Admitting: Certified Registered Nurse Anesthetist

## 2017-03-17 ENCOUNTER — Encounter (HOSPITAL_COMMUNITY)
Admission: EM | Disposition: A | Discharge status: Home / Self Care | Payer: Self-pay | Source: Home / Self Care | Attending: Surgery

## 2017-03-17 ENCOUNTER — Inpatient Hospital Stay (HOSPITAL_COMMUNITY): Payer: 59 | Admitting: Certified Registered Nurse Anesthetist

## 2017-03-17 ENCOUNTER — Inpatient Hospital Stay (HOSPITAL_COMMUNITY): Payer: 59

## 2017-03-17 DIAGNOSIS — R945 Abnormal results of liver function studies: Secondary | ICD-10-CM

## 2017-03-17 DIAGNOSIS — R932 Abnormal findings on diagnostic imaging of liver and biliary tract: Secondary | ICD-10-CM

## 2017-03-17 DIAGNOSIS — K8051 Calculus of bile duct without cholangitis or cholecystitis with obstruction: Secondary | ICD-10-CM

## 2017-03-17 DIAGNOSIS — R7989 Other specified abnormal findings of blood chemistry: Secondary | ICD-10-CM

## 2017-03-17 HISTORY — PX: ERCP: SHX5425

## 2017-03-17 LAB — HEPATIC FUNCTION PANEL
ALBUMIN: 3.4 g/dL — AB (ref 3.5–5.0)
ALK PHOS: 211 U/L — AB (ref 38–126)
ALT: 327 U/L — ABNORMAL HIGH (ref 14–54)
AST: 82 U/L — AB (ref 15–41)
Bilirubin, Direct: 2.2 mg/dL — ABNORMAL HIGH (ref 0.1–0.5)
Indirect Bilirubin: 2 mg/dL — ABNORMAL HIGH (ref 0.3–0.9)
TOTAL PROTEIN: 7.1 g/dL (ref 6.5–8.1)
Total Bilirubin: 4.2 mg/dL — ABNORMAL HIGH (ref 0.3–1.2)

## 2017-03-17 SURGERY — ERCP, WITH INTERVENTION IF INDICATED
Anesthesia: Choice

## 2017-03-17 SURGERY — ERCP, WITH INTERVENTION IF INDICATED
Anesthesia: General

## 2017-03-17 MED ORDER — MIDAZOLAM HCL 2 MG/2ML IJ SOLN
INTRAMUSCULAR | Status: AC
  Filled 2017-03-17: qty 2

## 2017-03-17 MED ORDER — PROPOFOL 10 MG/ML IV BOLUS
INTRAVENOUS | Status: DC | PRN
  Administered 2017-03-17: 180 mg via INTRAVENOUS

## 2017-03-17 MED ORDER — LACTATED RINGERS IV SOLN
INTRAVENOUS | Status: DC
  Administered 2017-03-17: 08:00:00 via INTRAVENOUS

## 2017-03-17 MED ORDER — ROCURONIUM BROMIDE 50 MG/5ML IV SOSY
PREFILLED_SYRINGE | INTRAVENOUS | Status: DC | PRN
  Administered 2017-03-17: 30 mg via INTRAVENOUS

## 2017-03-17 MED ORDER — INDOMETHACIN 50 MG RE SUPP
RECTAL | Status: AC
  Filled 2017-03-17: qty 2

## 2017-03-17 MED ORDER — GLUCAGON HCL RDNA (DIAGNOSTIC) 1 MG IJ SOLR
INTRAMUSCULAR | Status: DC | PRN
  Administered 2017-03-17: 0.25 mg via INTRAVENOUS

## 2017-03-17 MED ORDER — PROPOFOL 10 MG/ML IV BOLUS
INTRAVENOUS | Status: AC
  Filled 2017-03-17: qty 20

## 2017-03-17 MED ORDER — LIDOCAINE 2% (20 MG/ML) 5 ML SYRINGE
INTRAMUSCULAR | Status: DC | PRN
  Administered 2017-03-17: 100 mg via INTRAVENOUS

## 2017-03-17 MED ORDER — DEXAMETHASONE SODIUM PHOSPHATE 10 MG/ML IJ SOLN
INTRAMUSCULAR | Status: DC | PRN
  Administered 2017-03-17: 10 mg via INTRAVENOUS

## 2017-03-17 MED ORDER — ONDANSETRON HCL 4 MG/2ML IJ SOLN
INTRAMUSCULAR | Status: DC | PRN
  Administered 2017-03-17: 4 mg via INTRAVENOUS

## 2017-03-17 MED ORDER — GLUCAGON HCL RDNA (DIAGNOSTIC) 1 MG IJ SOLR
INTRAMUSCULAR | Status: AC
  Filled 2017-03-17: qty 1

## 2017-03-17 MED ORDER — ROCURONIUM BROMIDE 50 MG/5ML IV SOSY
PREFILLED_SYRINGE | INTRAVENOUS | Status: AC
Start: 2017-03-17 — End: 2017-03-17
  Filled 2017-03-17: qty 5

## 2017-03-17 MED ORDER — SUGAMMADEX SODIUM 200 MG/2ML IV SOLN
INTRAVENOUS | Status: AC
  Filled 2017-03-17: qty 2

## 2017-03-17 MED ORDER — FENTANYL CITRATE (PF) 100 MCG/2ML IJ SOLN
INTRAMUSCULAR | Status: DC | PRN
  Administered 2017-03-17 (×2): 50 ug via INTRAVENOUS

## 2017-03-17 MED ORDER — SUGAMMADEX SODIUM 200 MG/2ML IV SOLN
INTRAVENOUS | Status: DC | PRN
  Administered 2017-03-17: 200 mg via INTRAVENOUS

## 2017-03-17 MED ORDER — SUCCINYLCHOLINE CHLORIDE 200 MG/10ML IV SOSY
PREFILLED_SYRINGE | INTRAVENOUS | Status: DC | PRN
  Administered 2017-03-17: 120 mg via INTRAVENOUS

## 2017-03-17 MED ORDER — SODIUM CHLORIDE 0.9 % IV SOLN
INTRAVENOUS | Status: DC | PRN
  Administered 2017-03-17: 25 mL

## 2017-03-17 MED ORDER — FENTANYL CITRATE (PF) 100 MCG/2ML IJ SOLN
INTRAMUSCULAR | Status: AC
  Filled 2017-03-17: qty 2

## 2017-03-17 MED ORDER — ONDANSETRON HCL 4 MG/2ML IJ SOLN
INTRAMUSCULAR | Status: AC
  Filled 2017-03-17: qty 2

## 2017-03-17 MED ORDER — LIP MEDEX EX OINT
TOPICAL_OINTMENT | CUTANEOUS | Status: AC
Start: 2017-03-17 — End: 2017-03-17
  Filled 2017-03-17: qty 7

## 2017-03-17 MED ORDER — PHENYLEPHRINE 40 MCG/ML (10ML) SYRINGE FOR IV PUSH (FOR BLOOD PRESSURE SUPPORT)
PREFILLED_SYRINGE | INTRAVENOUS | Status: DC | PRN
  Administered 2017-03-17: 80 ug via INTRAVENOUS

## 2017-03-17 MED ORDER — LIDOCAINE 2% (20 MG/ML) 5 ML SYRINGE
INTRAMUSCULAR | Status: AC
  Filled 2017-03-17: qty 5

## 2017-03-17 MED ORDER — INDOMETHACIN 50 MG RE SUPP
RECTAL | Status: DC | PRN
  Administered 2017-03-17 (×2): 50 mg via RECTAL

## 2017-03-17 MED ORDER — SUCCINYLCHOLINE CHLORIDE 200 MG/10ML IV SOSY
PREFILLED_SYRINGE | INTRAVENOUS | Status: AC
  Filled 2017-03-17: qty 10

## 2017-03-17 MED ORDER — MIDAZOLAM HCL 5 MG/5ML IJ SOLN
INTRAMUSCULAR | Status: DC | PRN
  Administered 2017-03-17: 2 mg via INTRAVENOUS

## 2017-03-17 MED ORDER — FENTANYL CITRATE (PF) 100 MCG/2ML IJ SOLN
25.0000 ug | INTRAMUSCULAR | Status: DC | PRN
Start: 2017-03-17 — End: 2017-03-17

## 2017-03-17 NOTE — Transfer of Care (Signed)
Immediate Anesthesia Transfer of Care Note  Patient: [name]  Procedure(s) Performed: Procedure(s): ENDOSCOPIC RETROGRADE CHOLANGIOPANCREATOGRAPHY (ERCP) (N/A)  Patient Location: PACU  Anesthesia Type:General  Level of Consciousness:  sedated, patient cooperative and responds to stimulation  Airway & Oxygen Therapy:Patient Spontanous Breathing and Patient connected to face mask oxgen  Post-op Assessment:  Report given to PACU RN and Post -op Vital signs reviewed and stable  Post vital signs:  Reviewed and stable  Last Vitals:  Vitals:   03/17/17 0550 03/17/17 0723  BP: 132/74 137/89  Pulse: 85 84  Resp: 18 (!) 26  Temp: 36.8 C 36.6 C    Complications: No apparent anesthesia complications

## 2017-03-17 NOTE — Op Note (Addendum)
[facility] Patient Name: [name] Procedure Date: 03/17/2017 MRN: [id number] Attending MD: Meryl Dare , MD Date of Birth: [birth year] CSN: [id number] Age: 36 Admit Type: Inpatient Procedure:                ERCP Indications:              Abnormal intraoperative cholangiogram, Suspected                            bile duct stone(s), Elevated liver enzymes Providers:                Venita Lick. Russella Dar, MD, Norman Clay, RN, Lorenda Ishihara,                            Technician, Waymond Cera, CRNA Referring MD:             Luretha Murphy, MD Medicines:                General Anesthesia Complications:            No immediate complications. Estimated Blood Loss:     Estimated blood loss: none. Procedure:                Pre-Anesthesia Assessment:                           - Prior to the procedure, a History and Physical                            was performed, and patient medications and                            allergies were reviewed. The patient's tolerance of                            previous anesthesia was also reviewed. The risks                            and benefits of the procedure and the sedation                            options and risks were discussed with the patient.                            All questions were answered, and informed consent                            was obtained. Prior Anticoagulants: The patient has                            taken no previous anticoagulant or antiplatelet                            agents. ASA Grade Assessment: II - A patient with  mild systemic disease. After reviewing the risks                            and benefits, the patient was deemed in                            satisfactory condition to undergo the procedure.                           After obtaining informed consent, the scope was                            passed under direct vision. Throughout the   procedure, the patient's blood pressure, pulse, and                            oxygen saturations were monitored continuously. The                            [id number]-[id number] ([id number]) scope was introduced through                            the mouth, and used to inject contrast into and                            used to inject contrast into the bile duct. The                            ERCP was accomplished without difficulty. The                            patient tolerated the procedure well. Scope In: Scope Out: Findings:      A scout film of the abdomen was obtained. Surgical clips, consistent       with a previous cholecystectomy, were seen in the area of the right       upper quadrant of the abdomen. The esophagus was successfully intubated       under direct vision. The scope was advanced to a normal major papilla in       the descending duodenum without detailed examination of the pharynx,       larynx and associated structures, and upper GI tract. The upper GI tract       was grossly normal. The bile duct was deeply cannulated with the       short-nosed traction sphincterotome after guidewire cannulation.       Contrast was injected. I personally interpreted the bile duct images.       There was appropriate flow of contrast through the ducts. The lower       third of the main bile duct contained one filling defect thought to be a       stone measuring 5 mm. The common bile duct was mildly dilated and       diffusely dilated, with a stone causing an obstruction. The largest CBD       diameter was 10 mm. A cholecystectomy had been performed. A straight       Roadrunner  wire was passed into the biliary tree. A 9 mm biliary       sphincterotomy was made with a traction (standard) sphincterotome using       ERBE electrocautery. There was no post-sphincterotomy bleeding. The       biliary tree was swept twice with a 12 mm balloon starting at the       bifurcation. One stone was removed. No  stones remained. Excellent       biliary drainage noted post stone extraction. PD was initial cannulated       with the guidewire prior to achieving CBD cannulation. The PD was not       cannulated with the catheter and was not injected by intention. Impression:               - A filling defect consistent with a stone was seen                            on the cholangiogram.                           - The common bile duct was mildly dilated, with a                            stone causing an obstruction.                           - The patient has had a cholecystectomy.                           - Choledocholithiasis was found. Complete removal                            was accomplished by biliary sphincterotomy and                            balloon extraction. Moderate Sedation:      N/A- Per Anesthesia Care Recommendation:           - Avoid aspirin and nonsteroidal anti-inflammatory                            medicines for 2 weeks.                           - Return patient to hospital ward for ongoing care.                           - Continue present medications.                           - Check LFTs in 4-6 weeks.                           - Clear liquids for 2-3 hours then advance as                            tolerated. Procedure Code(s):        ---  Professional ---                           [phone], Endoscopic retrograde                            cholangiopancreatography (ERCP); with removal of                            calculi/debris from biliary/pancreatic duct(s)                           43262, Endoscopic retrograde                            cholangiopancreatography (ERCP); with                            sphincterotomy/papillotomy Diagnosis Code(s):        --- Professional ---                           R93.2, Abnormal findings on diagnostic imaging of                            liver and biliary tract                           K80.51, Calculus of bile duct without  cholangitis                            or cholecystitis with obstruction                           Z90.49, Acquired absence of other specified parts                            of digestive tract                           R74.8, Abnormal levels of other serum enzymes CPT copyright 2016 American Medical Association. All rights reserved. The codes documented in this report are preliminary and upon coder review may  be revised to meet current compliance requirements. Meryl Dare, MD 03/17/2017 8:30:08 AM This report has been signed electronically. Number of Addenda: 0

## 2017-03-17 NOTE — Anesthesia Procedure Notes (Signed)
Procedure Name: Intubation Date/Time: 03/17/2017 7:47 AM Performed by: Montel Clock Pre-anesthesia Checklist: Patient identified, Emergency Drugs available, Suction available, Patient being monitored and Timeout performed Patient Re-evaluated:Patient Re-evaluated prior to inductionOxygen Delivery Method: Circle system utilized Preoxygenation: Pre-oxygenation with 100% oxygen Intubation Type: IV induction Ventilation: Mask ventilation without difficulty Laryngoscope Size: Mac and 3 Grade View: Grade I Tube type: Oral Tube size: 7.5 mm Number of attempts: 1 Airway Equipment and Method: Stylet Placement Confirmation: ETT inserted through vocal cords under direct vision,  positive ETCO2 and breath sounds checked- equal and bilateral Secured at: 21 cm Tube secured with: Tape Dental Injury: Teeth and Oropharynx as per pre-operative assessment

## 2017-03-17 NOTE — Interval H&P Note (Signed)
History and Physical Interval Note:  03/17/2017 7:38 AM  [name]  has presented today for surgery, with the diagnosis of Choledocholithiasis  The various methods of treatment have been discussed with the patient and family. After consideration of risks, benefits and other options for treatment, the patient has consented to  Procedure(s): ENDOSCOPIC RETROGRADE CHOLANGIOPANCREATOGRAPHY (ERCP) (N/A) as a surgical intervention .  The patient's history has been reviewed, patient examined, no change in status, stable for surgery.  I have reviewed the patient's chart and labs.  Questions were answered to the patient's satisfaction.     Venita Lick. Russella Dar

## 2017-03-17 NOTE — H&P (View-Only) (Signed)
Referring Provider: [facility] Primary Care Physician:  Eartha Inch, MD Primary Gastroenterologist: Pt of Dr. Carman Ching for many years, now unassigned as [facility] is unable to see pt  Reason for Consultation: abnormal LFTs    Attending physician's note   I have taken a history, examined the patient and reviewed the chart. I agree with the Advanced Practitioner's note, impression and recommendations. Upper abdominal pain, cholelithiasis and elevated LFTs. US shows cholelithiasis, borderline GB wall thickness and a normal CBD measuring 5.1 mm in diameter. Concerning for choledocholithiasis however CBD appears normal and it is not dilated. Younger female so she is a higher than average risk for ERCP pancreatitis. MRCP ordered and if choledocholithiasis is noted will proceed with ERCP. If MRCP does not show choledocholithiasis would proceed with lap chole with IOC.   Claudette Head, MD Clementeen Graham [phone] Mon-Fri 8a-5p [phone] after 5p, weekends, holidays   ASSESSMENT AND PLAN:    56. 36 year old with radiating upper abdominal pain, nausea, cholelithiasis, and rising LFTs. No biliary duct dilation on U/S.  -Patient at increased risk for pancreatitis. Will obtain MRCP to evaluate for choledocholithiasis.  -If CBD stone found then will proceed with ERCP with stone extraction and probable sphincterotomy.  -check coags -trend LFTs  2. Hx of GERD / Asymptomatic since Nissen fundoplication in her 100's.   3 Chronic constipation.  Of note, patient has been trying to reestablish care with Dr. Randa Evens at [facility]. She was a longstanding patient of his but seen since 2002. She has had difficulty getting appointment with [facility]. Saw Dr. Elnoria Howard once in 2010 for celiac evaluation (PCP sent her). Patient didn't even realize Dr. Elnoria Howard was a GI doc at the time  4. Obesity    HPI: [name] is a 36 y.o. female who presented to ED yesterday with upper abdominal pain radiating  through to back and nausea. Patient seen at [facility] a year ago with same pain, told she had gallbladder sludge. She made dietary changes and has only had a couple of episodes of pain over the last years. Approximately 10 days ago she consumed food which has typically caused her to have epigastric pain. Since then she has continued to have intermittent pain which escalated a couple of days ago. Patient saw PCP, LFTs were normal. Pain progressed and she presented to ED yesterday.  LFTs elevated in ED, they have continued to rise. Ultrasound reveals cholelithiasis, borderline gallbladder wall thickening, CBD only 5.1 Currently her pain is controlled with analgesics.   Patient has a long history of GERD and chronic constipation. She is s/p Nissen fundoplication in her 49's. No dysphagia unless she eats too quickly. No GERD sx.   Past Medical History:  Diagnosis Date  . Asthma    seasonal  . Depression   . GERD (gastroesophageal reflux disease)   . LGSIL (low grade squamous intraepithelial lesion) on Pap smear 08/2004  . Migraines     Past Surgical History:  Procedure Laterality Date  . NASAL SINUS SURGERY    . nissenfundo plication  2005    Prior to Admission medications   Medication Sig Start Date End Date Taking? Authorizing Provider  acetaminophen (TYLENOL) 500 MG tablet Take 1,000 mg by mouth every 6 (six) hours as needed for mild pain, moderate pain, fever or headache.   Yes Historical Provider, MD  oxyCODONE-acetaminophen (PERCOCET/ROXICET) 5-325 MG tablet Take 1-2 tablets by mouth every 4 (four) hours as needed for severe pain.  Yes Historical Provider, MD    Current Facility-Administered Medications  Medication Dose Route Frequency Provider Last Rate Last Dose  . acetaminophen (TYLENOL) tablet 650 mg  650 mg Oral Q6H PRN Sherrie George, PA-C       Or  . acetaminophen (TYLENOL) suppository 650 mg  650 mg Rectal Q6H PRN Sherrie George, PA-C      . cefTRIAXone (ROCEPHIN)  2 g in dextrose 5 % 50 mL IVPB  2 g Intravenous Q24H Sherrie George, PA-C   Stopped at 03/13/17 1903  . dextrose 5 % and 0.45 % NaCl with KCl 20 mEq/L infusion   Intravenous Continuous Ovidio Kin, MD 100 mL/hr at 03/14/17 0916    . diphenhydrAMINE (BENADRYL) capsule 25 mg  25 mg Oral Q6H PRN Sherrie George, PA-C       Or  . diphenhydrAMINE (BENADRYL) injection 25 mg  25 mg Intravenous Q6H PRN Sherrie George, PA-C      . enoxaparin (LOVENOX) injection 40 mg  40 mg Subcutaneous Q24H Sherrie George, PA-C      . HYDROmorphone (DILAUDID) injection 1 mg  1 mg Intravenous Q3H PRN Ovidio Kin, MD   1 mg at 03/14/17 0916  . Influenza vac split quadrivalent PF (FLUARIX) injection 0.5 mL  0.5 mL Intramuscular Tomorrow-1000 Jacalyn Lefevre, MD      . ondansetron (ZOFRAN-ODT) disintegrating tablet 4 mg  4 mg Oral Q6H PRN Sherrie George, PA-C       Or  . ondansetron [facility]) injection 4 mg  4 mg Intravenous Q6H PRN Sherrie George, PA-C   4 mg at 03/14/17 0606  . oxyCODONE-acetaminophen (PERCOCET/ROXICET) 5-325 MG per tablet 1-2 tablet  1-2 tablet Oral Q4H PRN Sherrie George, PA-C      . pneumococcal 23 valent vaccine (PNU-IMMUNE) injection 0.5 mL  0.5 mL Intramuscular Tomorrow-1000 Jacalyn Lefevre, MD        Allergies as of 03/13/2017 - Review Complete 03/13/2017  Allergen Reaction Noted  . Nsaids Hives, Swelling, and Other (See Comments) 10/13/2013  . Sulfa antibiotics Swelling and Other (See Comments) 10/13/2013  . Latex Hives and Rash 10/13/2013    Family History  Problem Relation Age of Onset  . Hypertension Mother   . Endometriosis Sister   . Cancer Maternal Grandmother   . Diabetes Maternal Grandfather     Social History   Social History  . Marital status: Married    Spouse name: N/A  . Number of children: N/A  . Years of education: N/A   Occupational History  . Not on file.   Social History Main Topics  . Smoking status: Never Smoker  . Smokeless tobacco: Never Used   . Alcohol use 0.0 oz/week     Comment: occ  . Drug use: No  . Sexual activity: Yes   Other Topics Concern  . Not on file   Social History Narrative  . No narrative on file    Review of Systems: All systems reviewed and negative except where noted in HPI.  Physical Exam: Vital signs in last 24 hours: Temp:  [97.6 F (36.4 C)-98.9 F (37.2 C)] 98.4 F (36.9 C) (03/28 0548) Pulse Rate:  [75-86] 81 (03/28 0548) Resp:  [12-20] 16 (03/28 0548) BP: (109-141)/(67-97) 116/78 (03/28 0548) SpO2:  [97 %-100 %] 100 % (03/28 0548) Weight:  [240 lb 11.9 oz (109.2 kg)] 240 lb 11.9 oz (109.2 kg) (03/27 2043) Last BM Date: 03/13/17 General:   Alert, obese white female in NAD Eyes:  Sclera clear, no icterus.  Conjunctiva pink. Ears:  Normal auditory acuity. Nose:  No deformity, discharge,  or lesions. Neck:  Supple; no masses  Lungs:  Clear throughout to auscultation.   No wheezes, crackles, or rhonchi. Heart:  Regular rate and rhythm; no murmurs, no edema Abdomen:  Soft,nontender, BS active,no palp mass   Rectal:  Deferred  Msk:  Symmetrical without gross deformities. Psych: Pleasant , cooperative . Neurologic:  Alert and  oriented x4;  grossly normal neurologically. Skin:  Intact without significant lesions or rashes..  Intake/Output from previous day: 03/27 0701 - 03/28 0700 In: 726.7 [I.V.:676.7; IV Piggyback:50] Out: 750 [Urine:750] Intake/Output this shift: Total I/O In: 400 [I.V.:400] Out: -   Lab Results:  Recent Labs  03/13/17 1120 03/13/17 1722 03/14/17 0449  WBC 9.5 6.5 6.1  HGB 14.1 13.9 13.5  HCT 43.1 42.5 41.6  PLT 257 258 247   BMET  Recent Labs  03/13/17 1120 03/13/17 1722 03/14/17 0449  NA 137  --  138  K 3.6  --  4.1  CL 102  --  101  CO2 29  --  27  GLUCOSE 129*  --  137*  BUN 10  --  7  CREATININE 0.83 0.81 0.66  CALCIUM 9.5  --  9.3   LFT  Recent Labs  03/14/17 0449  PROT 7.4  ALBUMIN 3.7  AST 634*  ALT 829*  ALKPHOS 155*    BILITOT 3.3*   PT/INR No results for input(s): LABPROT, INR in the last 72 hours. Hepatitis Panel No results for input(s): HEPBSAG, HCVAB, HEPAIGM, HEPBIGM in the last 72 hours.   Studies/Results: US Abdomen Limited Ruq  Result Date: 03/13/2017 CLINICAL DATA:  Right upper quadrant abdominal pain over the last week. EXAM: US ABDOMEN LIMITED - RIGHT UPPER QUADRANT COMPARISON:  08/02/2015 FINDINGS: Gallbladder: Multiple small gallstones mobile within the gallbladder. Borderline gallbladder wall thickening. No surrounding fluid. Negative Murphy sign, the pain medicine has been administered. Common bile duct: Diameter: 5.1 mm, within normal limits Liver: No focal lesion identified. Within normal limits in parenchymal echogenicity. IMPRESSION: Numerous small stones mobile within the gallbladder. Borderline gallbladder wall thickening. Cannot assess accurately for Murphy sign given previous pain medicine minutes duration. The findings could represent early cholecystitis. Stones are of a size that could pass into the ductal system. Common duct within normal limits. Electronically Signed   By: Paulina Fusi M.D.   On: 03/13/2017 13:44    Willette Cluster, NP-C @  03/14/2017, 10:28 AM  Pager number [phone]

## 2017-03-17 NOTE — Anesthesia Preprocedure Evaluation (Signed)
Anesthesia Evaluation  Patient identified by MRN, date of birth, ID band Patient awake    Reviewed: Allergy & Precautions, NPO status , Patient's Chart, lab work & pertinent test results  History of Anesthesia Complications Negative for: history of anesthetic complications  Airway Mallampati: II  TM Distance: >3 FB Neck ROM: Full    Dental  (+) Teeth Intact   Pulmonary asthma ,    breath sounds clear to auscultation       Cardiovascular negative cardio ROS   Rhythm:Regular     Neuro/Psych  Headaches, Depression    GI/Hepatic Neg liver ROS, GERD  ,Bile duct stones post cholecystectomy   Endo/Other  Morbid obesity  Renal/GU negative Renal ROS     Musculoskeletal negative musculoskeletal ROS (+)   Abdominal   Peds  Hematology negative hematology ROS (+)   Anesthesia Other Findings   Reproductive/Obstetrics                             Anesthesia Physical Anesthesia Plan  ASA: II  Anesthesia Plan: General   Post-op Pain Management:    Induction: Intravenous  Airway Management Planned: Oral ETT  Additional Equipment: None  Intra-op Plan:   Post-operative Plan: Extubation in OR  Informed Consent: I have reviewed the patients History and Physical, chart, labs and discussed the procedure including the risks, benefits and alternatives for the proposed anesthesia with the patient or authorized representative who has indicated his/her understanding and acceptance.   Dental advisory given  Plan Discussed with: CRNA and Surgeon  Anesthesia Plan Comments:         Anesthesia Quick Evaluation

## 2017-03-18 ENCOUNTER — Other Ambulatory Visit: Payer: Self-pay | Admitting: Nurse Practitioner

## 2017-03-18 DIAGNOSIS — Z9049 Acquired absence of other specified parts of digestive tract: Secondary | ICD-10-CM

## 2017-03-18 DIAGNOSIS — R7989 Other specified abnormal findings of blood chemistry: Secondary | ICD-10-CM

## 2017-03-18 DIAGNOSIS — R945 Abnormal results of liver function studies: Secondary | ICD-10-CM

## 2017-03-18 LAB — HEPATIC FUNCTION PANEL
ALT: 312 U/L — AB (ref 14–54)
AST: 96 U/L — ABNORMAL HIGH (ref 15–41)
Albumin: 3.1 g/dL — ABNORMAL LOW (ref 3.5–5.0)
Alkaline Phosphatase: 209 U/L — ABNORMAL HIGH (ref 38–126)
BILIRUBIN DIRECT: 1.6 mg/dL — AB (ref 0.1–0.5)
BILIRUBIN INDIRECT: 1.1 mg/dL — AB (ref 0.3–0.9)
Total Bilirubin: 2.7 mg/dL — ABNORMAL HIGH (ref 0.3–1.2)
Total Protein: 6.8 g/dL (ref 6.5–8.1)

## 2017-03-18 MED ORDER — OXYCODONE-ACETAMINOPHEN 5-325 MG PO TABS: 1.0000 | ORAL_TABLET | ORAL | 0 refills | Status: DC | PRN

## 2017-03-18 MED ORDER — HYDROMORPHONE HCL 1 MG/ML IJ SOLN
1.0000 mg | INTRAMUSCULAR | Status: DC | PRN
  Administered 2017-03-18: 1 mg via INTRAVENOUS
  Filled 2017-03-18: qty 1

## 2017-03-18 NOTE — Discharge Summary (Signed)
Physician Discharge Summary  Patient ID: [name] MRN: [id number] DOB/AGE: Sep 08, 1981 35 y.o.  Admit date: 03/13/2017 Discharge date: 03/18/2017  Admission Diagnoses:  cholecystitis  Discharge Diagnoses:  same  Principal Problem:   S/P laparoscopic cholecystectomy March 2018 Active Problems:   Cholelithiasis with cholecystitis   Calculus of bile duct without cholangitis with obstruction   Elevated LFTs   Abnormal cholangiogram   Surgery:  Lap chole IOC followed by ERCP  Discharged Condition: improved  Hospital Course:   Had lap chole on Friday, ERCP on Saturday, and discharge on Easter Sunday.  Doing well  Consults: Claudette Head, MD  Significant Diagnostic Studies: IOC    Discharge Exam: Blood pressure 140/80, pulse 72, temperature 98.2 F (36.8 C), temperature source Oral, resp. rate 18, height  (1.676 m), weight 109.2 kg (240 lb 11.9 oz), last menstrual period 02/15/2017, SpO2 98 %, unknown if currently breastfeeding. Incisions healing OK.    Disposition: 01-Home or Self Care  Discharge Instructions    Call MD for:  persistant nausea and vomiting    Complete by:  As directed    Call MD for:  severe uncontrolled pain    Complete by:  As directed    Diet - low sodium heart healthy    Complete by:  As directed    Increase activity slowly    Complete by:  As directed      Allergies as of 03/18/2017      Reactions   Nsaids Hives, Swelling, Other (See Comments)   Pt states that her lips swelled.     Sulfa Antibiotics Swelling, Other (See Comments)   Pt states that her lips swelled.     Latex Hives, Rash      Medication List    TAKE these medications   acetaminophen 500 MG tablet Commonly known as:  TYLENOL Take 1,000 mg by mouth every 6 (six) hours as needed for mild pain, moderate pain, fever or headache.   oxyCODONE-acetaminophen 5-325 MG tablet Commonly known as:  PERCOCET/ROXICET Take 1-2 tablets by mouth every 4 (four) hours as needed for  severe pain.      Followup at [facility]--Call [phone] for appointment.   SignedValarie Merino 03/18/2017, 9:18 AM

## 2017-03-18 NOTE — Anesthesia Postprocedure Evaluation (Addendum)
Anesthesia Post Note  Patient: [name]  Procedure(s) Performed: Procedure(s) (LRB): ENDOSCOPIC RETROGRADE CHOLANGIOPANCREATOGRAPHY (ERCP) (N/A)  Patient location during evaluation: PACU Anesthesia Type: General Level of consciousness: awake and alert Pain management: pain level controlled Vital Signs Assessment: post-procedure vital signs reviewed and stable Respiratory status: spontaneous breathing, nonlabored ventilation, respiratory function stable and patient connected to nasal cannula oxygen Cardiovascular status: blood pressure returned to baseline and stable Postop Assessment: no signs of nausea or vomiting Anesthetic complications: no       Last Vitals:  Vitals:   03/18/17 0151 03/18/17 0459  BP: 134/83 140/80  Pulse: 77 72  Resp: 16 18  Temp: 36.8 C 36.8 C    Last Pain:  Vitals:   03/18/17 0459  TempSrc: Oral  PainSc:                  [name]

## 2017-03-18 NOTE — Progress Notes (Signed)
[facility] Progress Note  Chief Complaint:   Common bile duct stone  Subjective: Feels better, wants to eat. Surgery has advanced diet and planning for discharge today  Objective:  Vital signs in last 24 hours: Temp:  [97.6 F (36.4 C)-98.3 F (36.8 C)] 98.2 F (36.8 C) (04/01 0459) Pulse Rate:  [63-86] 72 (04/01 0459) Resp:  [16-18] 18 (04/01 0459) BP: (111-141)/(69-94) 140/80 (04/01 0459) SpO2:  [95 %-100 %] 98 % (04/01 0459) Last BM Date: 03/16/17 General:   Alert, pleasant obese white female  in NAD EENT:  Normal hearing, non icteric sclera, conjunctive pink.  Heart:  Regular rate and rhythm; no murmurs.no lower extremity edema Pulm: Normal respiratory effort. Abdomen:  Soft, obese, nondistended, nontender.  Normal bowel sounds, Neurologic:  Alert and  oriented x4;  grossly normal neurologically. Psych:  Alert and cooperative. Normal mood and affect.    Intake/Output from previous day: 03/31 0701 - 04/01 0700 In: 4090 [P.O.:840; I.V.:3200; IV Piggyback:50] Out: 1350 [Urine:1350] Intake/Output this shift: No intake/output data recorded.  Lab Results:  Recent Labs  03/16/17 1152  WBC 7.7  HGB 14.0  HCT 43.6  PLT 218   BMET  Recent Labs  03/16/17 1152  CREATININE 0.74   LFT  Recent Labs  03/18/17 0500  PROT 6.8  ALBUMIN 3.1*  AST 96*  ALT 312*  ALKPHOS 209*  BILITOT 2.7*  BILIDIR 1.6*  IBILI 1.1*   PT/INR No results for input(s): LABPROT, INR in the last 72 hours. Hepatitis Panel No results for input(s): HEPBSAG, HCVAB, HEPAIGM, HEPBIGM in the last 72 hours.  Dg Ercp Biliary & Pancreatic Ducts  Result Date: 03/17/2017 CLINICAL DATA:  ERCP for bile stones. EXAM: ERCP TECHNIQUE: Multiple spot images obtained with the fluoroscopic device and submitted for interpretation post-procedure. FLUOROSCOPY TIME:  Fluoroscopy Time:  2 minutes and 11 seconds. Radiation Exposure Index (if provided by the fluoroscopic device): 31.02 mGy  COMPARISON:  MRCP 07/20/2017 FINDINGS: Cannulation and opacification of the extrahepatic biliary system. Biliary system is nondilated. Question a filling defect or stone in the common bile duct. Evidence for a balloon sweep for stone removal. IMPRESSION: ERCP for stone removal. These images were submitted for radiologic interpretation only. Please see the procedural report for the amount of contrast and the fluoroscopy time utilized. Electronically Signed   By: Richarda Overlie M.D.   On: 03/17/2017 09:48    Assessment / Plan:  36 yo female with cholelithiasis, s/p lap chole with abnormal IOC. She underwent ERCP with stone extraction and sphincterotomy yesterday. Bili down from 4.2 to 2.7, otherwise not much improvement yet in LFTs, especially transaminases.  -Diet was advanced by surgery and for discharge today.  -Recommend repeat LFTs in 4-6 weeks at our office (lab in basement). I will put in a standing order for the labs.    Principal Problem:   S/P laparoscopic cholecystectomy March 2018 Active Problems:   Cholelithiasis with cholecystitis   Calculus of bile duct without cholangitis with obstruction   Elevated LFTs   Abnormal cholangiogram    LOS: 5 days   Willette Cluster NP 03/18/2017, 9:59 AM  Pager number [phone]    Attending physician's note   I have taken an interval history, reviewed the chart and examined the patient. I agree with the Advanced Practitioner's note, impression and recommendations. Stable post ERCP,sphincterotomy and stone extraction. Discharge planned today. Check LFTs in 4-6 weeks. GI follow up prn.   Claudette Head, MD Clement[facility] Graham [phone] Mon-Fri 8a-5p [phone]  after 5p, weekends, holidays

## 2017-03-19 ENCOUNTER — Encounter (HOSPITAL_COMMUNITY): Payer: Self-pay | Admitting: Gastroenterology

## 2017-03-22 ENCOUNTER — Other Ambulatory Visit (INDEPENDENT_AMBULATORY_CARE_PROVIDER_SITE_OTHER): Payer: 59

## 2017-03-22 ENCOUNTER — Telehealth: Payer: Self-pay | Admitting: Gastroenterology

## 2017-03-22 DIAGNOSIS — R945 Abnormal results of liver function studies: Secondary | ICD-10-CM

## 2017-03-22 DIAGNOSIS — R7989 Other specified abnormal findings of blood chemistry: Secondary | ICD-10-CM

## 2017-03-22 LAB — HEPATIC FUNCTION PANEL
ALK PHOS: 202 U/L — AB (ref 39–117)
ALT: 473 U/L — ABNORMAL HIGH (ref 0–35)
AST: 123 U/L — ABNORMAL HIGH (ref 0–37)
Albumin: 4.2 g/dL (ref 3.5–5.2)
Bilirubin, Direct: 0.6 mg/dL — ABNORMAL HIGH (ref 0.0–0.3)
TOTAL PROTEIN: 8.1 g/dL (ref 6.0–8.3)
Total Bilirubin: 1.4 mg/dL — ABNORMAL HIGH (ref 0.2–1.2)

## 2017-03-22 MED ORDER — ONDANSETRON HCL 4 MG PO TABS: 4.0000 mg | ORAL_TABLET | Freq: Three times a day (TID) | ORAL | 0 refills | Status: DC | PRN

## 2017-03-22 NOTE — Telephone Encounter (Signed)
Patient notified She has follow up with her surgeon on 04/04/17

## 2017-03-22 NOTE — Telephone Encounter (Signed)
Patient reports that she is having nausea. S/P ERCP on 03/17/17 She had some zofran at the house and this has helped, but she is now out.  She is requesting a rx for zofran.  Denies pain, fever, or other complaints.  Ok to send in zofran rx?

## 2017-03-22 NOTE — Telephone Encounter (Signed)
Zofran 4 mg tid prn, #30, no refills Her recent ERCP is not the cause of nausea.  She should contact [facility] with any additional complaints, problems following lap chole Office follow up with [facility] post lap chole

## 2017-03-26 ENCOUNTER — Other Ambulatory Visit: Payer: Self-pay

## 2017-03-26 DIAGNOSIS — R945 Abnormal results of liver function studies: Principal | ICD-10-CM

## 2017-03-26 DIAGNOSIS — R7989 Other specified abnormal findings of blood chemistry: Secondary | ICD-10-CM

## 2017-03-29 ENCOUNTER — Other Ambulatory Visit (INDEPENDENT_AMBULATORY_CARE_PROVIDER_SITE_OTHER): Payer: 59

## 2017-03-29 DIAGNOSIS — R7989 Other specified abnormal findings of blood chemistry: Secondary | ICD-10-CM

## 2017-03-29 DIAGNOSIS — R945 Abnormal results of liver function studies: Principal | ICD-10-CM

## 2017-03-29 LAB — HEPATIC FUNCTION PANEL
ALBUMIN: 4 g/dL (ref 3.5–5.2)
ALK PHOS: 123 U/L — AB (ref 39–117)
ALT: 87 U/L — AB (ref 0–35)
AST: 26 U/L (ref 0–37)
BILIRUBIN DIRECT: 0.2 mg/dL (ref 0.0–0.3)
TOTAL PROTEIN: 7.5 g/dL (ref 6.0–8.3)
Total Bilirubin: 0.8 mg/dL (ref 0.2–1.2)

## 2017-03-29 LAB — PROTIME-INR
INR: 1.1 ratio — AB (ref 0.8–1.0)
PROTHROMBIN TIME: 11.2 s (ref 9.6–13.1)

## 2017-04-02 ENCOUNTER — Other Ambulatory Visit: Payer: Self-pay

## 2017-04-02 DIAGNOSIS — R945 Abnormal results of liver function studies: Principal | ICD-10-CM

## 2017-04-02 DIAGNOSIS — R7989 Other specified abnormal findings of blood chemistry: Secondary | ICD-10-CM

## 2017-05-02 ENCOUNTER — Encounter: Payer: Self-pay | Admitting: Gynecology

## 2017-05-21 NOTE — Addendum Note (Signed)
Addendum  created 05/21/17 1307 by Okechukwu Regnier, MD   Sign clinical note    

## 2017-05-21 NOTE — Addendum Note (Signed)
Addendum  created 05/21/17 1302 by Jahne Krukowski, MD   Sign clinical note    

## 2017-10-21 ENCOUNTER — Encounter (HOSPITAL_COMMUNITY): Payer: Self-pay | Admitting: *Deleted

## 2017-10-21 ENCOUNTER — Inpatient Hospital Stay (HOSPITAL_COMMUNITY)
Admission: AD | Admit: 2017-10-21 | Discharge: 2017-10-21 | Disposition: A | Discharge status: Home / Self Care | Payer: 59 | Source: Ambulatory Visit | Attending: Obstetrics & Gynecology | Admitting: Obstetrics & Gynecology

## 2017-10-21 ENCOUNTER — Inpatient Hospital Stay (HOSPITAL_COMMUNITY): Payer: 59

## 2017-10-21 DIAGNOSIS — O26891 Other specified pregnancy related conditions, first trimester: Secondary | ICD-10-CM | POA: Insufficient documentation

## 2017-10-21 DIAGNOSIS — O3680X0 Pregnancy with inconclusive fetal viability, not applicable or unspecified: Secondary | ICD-10-CM

## 2017-10-21 DIAGNOSIS — O209 Hemorrhage in early pregnancy, unspecified: Secondary | ICD-10-CM | POA: Diagnosis not present

## 2017-10-21 DIAGNOSIS — Z3A01 Less than 8 weeks gestation of pregnancy: Secondary | ICD-10-CM | POA: Diagnosis not present

## 2017-10-21 DIAGNOSIS — R109 Unspecified abdominal pain: Secondary | ICD-10-CM | POA: Diagnosis present

## 2017-10-21 LAB — WET PREP, GENITAL
CLUE CELLS WET PREP: NONE SEEN
Sperm: NONE SEEN
TRICH WET PREP: NONE SEEN
Yeast Wet Prep HPF POC: NONE SEEN

## 2017-10-21 LAB — URINALYSIS, MICROSCOPIC (REFLEX)

## 2017-10-21 LAB — URINALYSIS, ROUTINE W REFLEX MICROSCOPIC
Glucose, UA: NEGATIVE mg/dL
Ketones, ur: 15 mg/dL — AB
Nitrite: NEGATIVE
PH: 5 (ref 5.0–8.0)
Protein, ur: 30 mg/dL — AB
SPECIFIC GRAVITY, URINE: 1.025 (ref 1.005–1.030)

## 2017-10-21 LAB — CBC
HCT: 42.3 % (ref 36.0–46.0)
HEMOGLOBIN: 13.9 g/dL (ref 12.0–15.0)
MCH: 29.5 pg (ref 26.0–34.0)
MCHC: 32.9 g/dL (ref 30.0–36.0)
MCV: 89.8 fL (ref 78.0–100.0)
PLATELETS: 261 10*3/uL (ref 150–400)
RBC: 4.71 MIL/uL (ref 3.87–5.11)
RDW: 13.9 % (ref 11.5–15.5)
WBC: 7.6 10*3/uL (ref 4.0–10.5)

## 2017-10-21 LAB — ABO/RH: ABO/RH(D): A POS

## 2017-10-21 LAB — POCT PREGNANCY, URINE: PREG TEST UR: POSITIVE — AB

## 2017-10-21 LAB — HCG, QUANTITATIVE, PREGNANCY: HCG, BETA CHAIN, QUANT, S: 36 m[IU]/mL — AB (ref <5)

## 2017-10-21 NOTE — MAU Note (Signed)
Pt started bleeding & cramping when she woke up this morning, is passing tissue.  Pos HPT in October.  Advised by Dr. Claiborne Billingsallahan to come in.  Has upcoming OB appointment this week.

## 2017-10-21 NOTE — Progress Notes (Addendum)
G4P3 @ [redacted] wksga. Presents to triage for abdominal pain and bleeding with particals that started this morning. Pt called her dr and dr. Claiborne Billingsallahan advised pt to come in.   1500[facility] and speculum exam.   1540: paged for u/s.   1609: back from U/S  1641: Provider aware of u/s report  1741: Discharge instructions given with pt understanding.

## 2017-10-21 NOTE — MAU Provider Note (Signed)
Chief Complaint: Back Pain and Vaginal Bleeding   First Provider Initiated Contact with Patient 10/21/17 1508        SUBJECTIVE HPI: [name] is a 36 y.o. [id number] at [redacted]w[redacted]d by LMP who presents to maternity admissions reporting vaginal bleeding and abdominal cramping. She reports positive home pregnancy test in October, and has her first OB appt scheduled for later this week. She reports she has some light vaginal bleeding/spotting a few days ago, which resolved. This morning she started having abdominal cramping, and started bleeding again, and it has been like light menses, and thinks she may have seen some tissue as well. Denies any other symptoms, including no fever, chills, vaginal itching/burning, urinary symptoms, h/a, dizziness, n/v.   Past Medical History:  Diagnosis Date  . Asthma    seasonal  . Depression   . GERD (gastroesophageal reflux disease)   . LGSIL (low grade squamous intraepithelial lesion) on Pap smear 08/2004  . Migraines    Past Surgical History:  Procedure Laterality Date  . NASAL SINUS SURGERY    . nissenfundo plication  2005   Social History   Socioeconomic History  . Marital status: Married    Spouse name: Not on file  . Number of children: Not on file  . Years of education: Not on file  . Highest education level: Not on file  Social Needs  . Financial resource strain: Not on file  . Food insecurity - worry: Not on file  . Food insecurity - inability: Not on file  . Transportation needs - medical: Not on file  . Transportation needs - non-medical: Not on file  Occupational History  . Not on file  Tobacco Use  . Smoking status: Never Smoker  . Smokeless tobacco: Never Used  Substance and Sexual Activity  . Alcohol use: Yes    Alcohol/week: 0.0 oz    Comment: occ  . Drug use: No  . Sexual activity: Yes  Other Topics Concern  . Not on file  Social History Narrative  . Not on file   No current facility-administered medications on file  prior to encounter.    Current Outpatient Medications on File Prior to Encounter  Medication Sig Dispense Refill  . acetaminophen (TYLENOL) 500 MG tablet Take 1,000 mg by mouth every 6 (six) hours as needed for mild pain, moderate pain, fever or headache.    . ondansetron (ZOFRAN) 4 MG tablet Take 1 tablet (4 mg total) by mouth every 8 (eight) hours as needed for nausea or vomiting. 30 tablet 0  . oxyCODONE-acetaminophen (PERCOCET/ROXICET) 5-325 MG tablet Take 1-2 tablets by mouth every 4 (four) hours as needed for severe pain. 30 tablet 0   Allergies  Allergen Reactions  . Nsaids Hives, Swelling and Other (See Comments)    Pt states that her lips swelled.    . Sulfa Antibiotics Swelling and Other (See Comments)    Pt states that her lips swelled.    . Latex Hives and Rash    I have reviewed patient's Past Medical Hx, Surgical Hx, Family Hx, Social Hx, medications and allergies.   ROS: Negative except for what is mentioned in HPI.  Physical Exam   Patient Vitals for the past 24 hrs:  BP Temp Temp src Pulse Resp Height Weight  10/21/17 1255 124/78 98.3 F (36.8 C) Oral 87 18 5\' 6"  (1.676 m) 244 lb (110.7 kg)   Constitutional: Well-developed, well-nourished female in no acute distress.  HENT: [place]/AT, normal oral mucosa Eyes:  no scleral icterus  Cardiovascular: normal rate Respiratory: normal effort GI: Abd soft, non-tender, non-distended MS: Extremities nontender, no edema, normal ROM Neurologic: Alert and oriented x 4.  PELVIC EXAM: Cervix pink, moderate amount of blood in vaginal vault, cervix is visually open < 1cm,, vaginal walls and external genitalia normal. Bimanual exam: Cervix FT/thick, neg CMT, uterus nontender, nonenlarged, adnexa without tenderness, enlargement, or mass   LAB RESULTS Results for orders placed or performed during the hospital encounter of 10/21/17 (from the past 24 hour(s))  Urinalysis, Routine w reflex microscopic     Status: Abnormal   Collection  Time: 10/21/17  1:00 PM  Result Value Ref Range   Color, Urine AMBER (A) YELLOW   APPearance CLOUDY (A) CLEAR   Specific Gravity, Urine 1.025 1.005 - 1.030   pH 5.0 5.0 - 8.0   Glucose, UA NEGATIVE NEGATIVE mg/dL   Hgb urine dipstick LARGE (A) NEGATIVE   Bilirubin Urine SMALL (A) NEGATIVE   Ketones, ur 15 (A) NEGATIVE mg/dL   Protein, ur 30 (A) NEGATIVE mg/dL   Nitrite NEGATIVE NEGATIVE   Leukocytes, UA TRACE (A) NEGATIVE  Urinalysis, Microscopic (reflex)     Status: Abnormal   Collection Time: 10/21/17  1:00 PM  Result Value Ref Range   RBC / HPF TOO NUMEROUS TO COUNT 0 - 5 RBC/hpf   WBC, UA 0-5 0 - 5 WBC/hpf   Bacteria, UA FEW (A) NONE SEEN   Squamous Epithelial / LPF 0-5 (A) NONE SEEN  Pregnancy, urine POC     Status: Abnormal   Collection Time: 10/21/17  1:24 PM  Result Value Ref Range   Preg Test, Ur POSITIVE (A) NEGATIVE  ABO/Rh     Status: None   Collection Time: 10/21/17  2:04 PM  Result Value Ref Range   ABO/RH(D) A POS   CBC     Status: None   Collection Time: 10/21/17  2:04 PM  Result Value Ref Range   WBC 7.6 4.0 - 10.5 K/uL   RBC 4.71 3.87 - 5.11 MIL/uL   Hemoglobin 13.9 12.0 - 15.0 g/dL   HCT 40.9 81.1 - 91.4 %   MCV 89.8 78.0 - 100.0 fL   MCH 29.5 26.0 - 34.0 pg   MCHC 32.9 30.0 - 36.0 g/dL   RDW 78.2 95.6 - 21.3 %   Platelets 261 150 - 400 K/uL  hCG, quantitative, pregnancy     Status: Abnormal   Collection Time: 10/21/17  2:04 PM  Result Value Ref Range   hCG, Beta Chain, Quant, S 36 (H) <5 mIU/mL  Wet prep, genital     Status: Abnormal   Collection Time: 10/21/17  3:03 PM  Result Value Ref Range   Yeast Wet Prep HPF POC NONE SEEN NONE SEEN   Trich, Wet Prep NONE SEEN NONE SEEN   Clue Cells Wet Prep HPF POC NONE SEEN NONE SEEN   WBC, Wet Prep HPF POC FEW (A) NONE SEEN   Sperm NONE SEEN     --/--/A POS (11/04 1404)  IMAGING US Ob Comp Less 14 Wks  Result Date: 10/21/2017 CLINICAL DATA:  Bleeding and cramping since this morning. Pregnant  patient. EXAM: OBSTETRIC <14 WK [place] AND TRANSVAGINAL OB US TECHNIQUE: Both transabdominal and transvaginal ultrasound examinations were performed for complete evaluation of the gestation as well as the maternal uterus, adnexal regions, and pelvic cul-de-sac. Transvaginal technique was performed to assess early pregnancy. COMPARISON:  None. FINDINGS: Intrauterine gestational sac: None Maternal uterus/adnexae: The ovaries are  normal in appearance. No significant free fluid in the pelvis. IMPRESSION: 1. An intrauterine pregnancy is not identified. The absence of an intrauterine pregnancy in the setting of a positive pregnancy test can be seen with early pregnancy, recent miscarriage, or ectopic pregnancy. Recommend clinical correlation and close follow-up. Electronically Signed   By: Gerome Samavid  Williams III M.D   On: 10/21/2017 16:21   [place] Ob Transvaginal  Result Date: 10/21/2017 CLINICAL DATA:  Bleeding and cramping since this morning. Pregnant patient. EXAM: OBSTETRIC <14 WK US AND TRANSVAGINAL OB US TECHNIQUE: Both transabdominal and transvaginal ultrasound examinations were performed for complete evaluation of the gestation as well as the maternal uterus, adnexal regions, and pelvic cul-de-sac. Transvaginal technique was performed to assess early pregnancy. COMPARISON:  None. FINDINGS: Intrauterine gestational sac: None Maternal uterus/adnexae: The ovaries are normal in appearance. No significant free fluid in the pelvis. IMPRESSION: 1. An intrauterine pregnancy is not identified. The absence of an intrauterine pregnancy in the setting of a positive pregnancy test can be seen with early pregnancy, recent miscarriage, or ectopic pregnancy. Recommend clinical correlation and close follow-up. Electronically Signed   By: Gerome Samavid  Williams III M.D   On: 10/21/2017 16:21    MAU Management/MDM: Pelvic exam and cultures done Cultures were done to rule out pelvic infection Blood drawn for Quant HCG, CBC, ABO/Rh Will  check baseline Ultrasound to rule out ectopic.  Results reviewed above, and discussed with patient, likely complete miscarriage, but reviewed importance of follow up given no prior knowledge of pregnancy location.  Consult Dr. Dareen PianoAnderson with presentation, exam findings, and results.  Agrees with managememt   ASSESSMENT 1. Pregnancy of unknown anatomic location   2. Vaginal bleeding in pregnancy, first trimester   3. Abdominal pain during pregnancy in first trimester     PLAN --Discharge home in stable condition.  --Plan to repeat HCG level in 48 hours in clinic per 11:00 am schedule --Ectopic precautions  Pt stable at time of discharge. Encouraged to return here or to other Urgent Care/ED if she develops worsening of symptoms, increase in pain, fever, or other concerning symptoms.   Raynelle FanningJulie P. Degele, MD OB Fellow  10/21/2017  5:45 PM

## 2017-10-21 NOTE — Discharge Instructions (Signed)

## 2017-10-22 LAB — GC/CHLAMYDIA PROBE AMP (~~LOC~~) NOT AT ARMC
CHLAMYDIA, DNA PROBE: NEGATIVE
Neisseria Gonorrhea: NEGATIVE

## 2017-10-23 ENCOUNTER — Ambulatory Visit: Payer: 59

## 2017-10-23 ENCOUNTER — Telehealth: Payer: Self-pay

## 2017-10-23 NOTE — Telephone Encounter (Signed)
LM for pt that she missed her important appt that was scheduled today at 1100.  If she could please give the office a call to reschedule asap.

## 2017-12-18 NOTE — L&D Delivery Note (Signed)
Delivery Note At 2:04 PM a viable and healthy female was delivered via Vaginal, Spontaneous (Presentation: Right Occiput; Anterior ).  APGAR: 9, 9; weight pending .   Placenta status: spontaneous, intact. Cord: 3V  Anesthesia: Epidural   Episiotomy: None Lacerations: 1st degree Suture Repair: 3.0 vicryl Est. Blood Loss (mL): 52  Mom to postpartum.  Baby to Couplet care / Skin to Skin.  Waynard Reeds 10/21/2018, 2:22 PM

## 2018-04-04 LAB — OB RESULTS CONSOLE GC/CHLAMYDIA
Chlamydia: NEGATIVE
GC PROBE AMP, GENITAL: NEGATIVE

## 2018-04-04 LAB — OB RESULTS CONSOLE HEPATITIS B SURFACE ANTIGEN: Hepatitis B Surface Ag: NEGATIVE

## 2018-04-04 LAB — OB RESULTS CONSOLE HIV ANTIBODY (ROUTINE TESTING): HIV: NONREACTIVE

## 2018-04-04 LAB — OB RESULTS CONSOLE ABO/RH: RH Type: POSITIVE

## 2018-04-04 LAB — OB RESULTS CONSOLE RPR: RPR: NONREACTIVE

## 2018-04-04 LAB — OB RESULTS CONSOLE RUBELLA ANTIBODY, IGM: Rubella: IMMUNE

## 2018-04-04 LAB — OB RESULTS CONSOLE ANTIBODY SCREEN: Antibody Screen: NEGATIVE

## 2018-06-14 ENCOUNTER — Other Ambulatory Visit: Payer: Self-pay

## 2018-06-14 ENCOUNTER — Inpatient Hospital Stay (HOSPITAL_COMMUNITY)
Admission: AD | Admit: 2018-06-14 | Discharge: 2018-06-14 | Disposition: A | Discharge status: Home / Self Care | Payer: 59 | Source: Ambulatory Visit | Attending: Obstetrics and Gynecology | Admitting: Obstetrics and Gynecology

## 2018-06-14 ENCOUNTER — Encounter (HOSPITAL_COMMUNITY): Payer: Self-pay | Admitting: *Deleted

## 2018-06-14 DIAGNOSIS — Z3A20 20 weeks gestation of pregnancy: Secondary | ICD-10-CM | POA: Insufficient documentation

## 2018-06-14 DIAGNOSIS — R55 Syncope and collapse: Secondary | ICD-10-CM | POA: Insufficient documentation

## 2018-06-14 DIAGNOSIS — F329 Major depressive disorder, single episode, unspecified: Secondary | ICD-10-CM | POA: Insufficient documentation

## 2018-06-14 DIAGNOSIS — Z886 Allergy status to analgesic agent status: Secondary | ICD-10-CM | POA: Diagnosis not present

## 2018-06-14 DIAGNOSIS — Z8249 Family history of ischemic heart disease and other diseases of the circulatory system: Secondary | ICD-10-CM | POA: Insufficient documentation

## 2018-06-14 DIAGNOSIS — R110 Nausea: Secondary | ICD-10-CM | POA: Diagnosis present

## 2018-06-14 DIAGNOSIS — O26892 Other specified pregnancy related conditions, second trimester: Secondary | ICD-10-CM | POA: Diagnosis not present

## 2018-06-14 DIAGNOSIS — Z882 Allergy status to sulfonamides status: Secondary | ICD-10-CM | POA: Insufficient documentation

## 2018-06-14 DIAGNOSIS — J45909 Unspecified asthma, uncomplicated: Secondary | ICD-10-CM | POA: Diagnosis not present

## 2018-06-14 DIAGNOSIS — O99612 Diseases of the digestive system complicating pregnancy, second trimester: Secondary | ICD-10-CM | POA: Diagnosis not present

## 2018-06-14 DIAGNOSIS — O2652 Maternal hypotension syndrome, second trimester: Secondary | ICD-10-CM | POA: Diagnosis not present

## 2018-06-14 DIAGNOSIS — Z9049 Acquired absence of other specified parts of digestive tract: Secondary | ICD-10-CM | POA: Insufficient documentation

## 2018-06-14 DIAGNOSIS — K219 Gastro-esophageal reflux disease without esophagitis: Secondary | ICD-10-CM | POA: Insufficient documentation

## 2018-06-14 DIAGNOSIS — O99512 Diseases of the respiratory system complicating pregnancy, second trimester: Secondary | ICD-10-CM | POA: Insufficient documentation

## 2018-06-14 DIAGNOSIS — O99342 Other mental disorders complicating pregnancy, second trimester: Secondary | ICD-10-CM | POA: Insufficient documentation

## 2018-06-14 HISTORY — DX: Unspecified abnormal cytological findings in specimens from vagina: R87.629

## 2018-06-14 LAB — URINALYSIS, ROUTINE W REFLEX MICROSCOPIC
Bilirubin Urine: NEGATIVE
GLUCOSE, UA: NEGATIVE mg/dL
HGB URINE DIPSTICK: NEGATIVE
KETONES UR: NEGATIVE mg/dL
LEUKOCYTES UA: NEGATIVE
Nitrite: NEGATIVE
Protein, ur: NEGATIVE mg/dL
Specific Gravity, Urine: 1.002 — ABNORMAL LOW (ref 1.005–1.030)
pH: 7 (ref 5.0–8.0)

## 2018-06-14 LAB — CBC WITH DIFFERENTIAL/PLATELET
BASOS PCT: 0 %
Basophils Absolute: 0 10*3/uL (ref 0.0–0.1)
EOS PCT: 1 %
Eosinophils Absolute: 0.1 10*3/uL (ref 0.0–0.7)
HEMATOCRIT: 38.9 % (ref 36.0–46.0)
Hemoglobin: 12.9 g/dL (ref 12.0–15.0)
Lymphocytes Relative: 23 %
Lymphs Abs: 2 10*3/uL (ref 0.7–4.0)
MCH: 29.9 pg (ref 26.0–34.0)
MCHC: 33.2 g/dL (ref 30.0–36.0)
MCV: 90.3 fL (ref 78.0–100.0)
MONO ABS: 0.2 10*3/uL (ref 0.1–1.0)
Monocytes Relative: 2 %
NEUTROS ABS: 6.5 10*3/uL (ref 1.7–7.7)
Neutrophils Relative %: 74 %
Platelets: 230 10*3/uL (ref 150–400)
RBC: 4.31 MIL/uL (ref 3.87–5.11)
RDW: 13.3 % (ref 11.5–15.5)
WBC: 8.7 10*3/uL (ref 4.0–10.5)

## 2018-06-14 LAB — COMPREHENSIVE METABOLIC PANEL
ALBUMIN: 3.3 g/dL — AB (ref 3.5–5.0)
ALT: 33 U/L (ref 0–44)
ANION GAP: 9 (ref 5–15)
AST: 20 U/L (ref 15–41)
Alkaline Phosphatase: 69 U/L (ref 38–126)
BILIRUBIN TOTAL: 0.5 mg/dL (ref 0.3–1.2)
BUN: 8 mg/dL (ref 6–20)
CO2: 23 mmol/L (ref 22–32)
Calcium: 9.2 mg/dL (ref 8.9–10.3)
Chloride: 101 mmol/L (ref 98–111)
Creatinine, Ser: 0.59 mg/dL (ref 0.44–1.00)
GFR calc Af Amer: >60 mL/min (ref >60)
Glucose, Bld: 90 mg/dL (ref 70–99)
POTASSIUM: 3.8 mmol/L (ref 3.5–5.1)
Sodium: 133 mmol/L — ABNORMAL LOW (ref 135–145)
TOTAL PROTEIN: 7 g/dL (ref 6.5–8.1)

## 2018-06-14 NOTE — Discharge Instructions (Signed)
Hypotension As your heart beats, it forces blood through your body. This force is called blood pressure. If you have hypotension, you have low blood pressure. When your blood pressure is too low, you may not get enough blood to your brain. You may feel weak, feel light-headed, have a fast heartbeat, or even pass out (faint). Follow these instructions at home: Eating and drinking  Drink enough fluids to keep your pee (urine) clear or pale yellow.  Eat a healthy diet, and follow instructions from your doctor about eating or drinking restrictions. A healthy diet includes: ? Fresh fruits and vegetables. ? Whole grains. ? Low-fat (lean) meats. ? Low-fat dairy products.  Eat extra salt only as told. Do not add extra salt to your diet unless your doctor tells you to.  Eat small meals often.  Avoid standing up quickly after you eat. Medicines  Take over-the-counter and prescription medicines only as told by your doctor. ? Follow instructions from your doctor about changing how much you take (the dosage) of your medicines, if this applies. ? Do not stop or change your medicine on your own. General instructions  Wear compression stockings as told by your doctor.  Get up slowly from lying down or sitting.  Avoid hot showers and a lot of heat as told by your doctor.  Return to your normal activities as told by your doctor. Ask what activities are safe for you.  Do not use any products that contain nicotine or tobacco, such as cigarettes and e-cigarettes. If you need help quitting, ask your doctor.  Keep all follow-up visits as told by your doctor. This is important. Contact a doctor if:  You throw up (vomit).  You have watery poop (diarrhea).  You have a fever for more than 2-3 days.  You feel more thirsty than normal.  You feel weak and tired. Get help right away if:  You have chest pain.  You have a fast or irregular heartbeat.  You lose feeling (get numbness) in any part  of your body.  You cannot move your arms or your legs.  You have trouble talking.  You get sweaty or feel light-headed.  You faint.  You have trouble breathing.  You have trouble staying awake.  You feel confused. This information is not intended to replace advice given to you by your health care provider. Make sure you discuss any questions you have with your health care provider. Document Released: 02/28/2010 Document Revised: 08/22/2016 Document Reviewed: 08/22/2016 Elsevier Interactive Patient Education  2017 Elsevier Inc.   

## 2018-06-14 NOTE — MAU Provider Note (Signed)
History     CSN: [id number]  Arrival date and time: 06/14/18 1107   First Provider Initiated Contact with Patient 06/14/18 1133      Chief Complaint  Patient presents with  . Nausea  . Dizziness   HPI  Ms. [name] is a 37 y.o. [phone] at [redacted]w[redacted]d who presents to MAU today with complaint of 2-3 near syncopal episodes this morning. She has had persistent nausea without vomiting throughout the pregnancy. She denies any recent fever, diarrhea, vaginal bleeding, LOF or contractions. She has recently changed prenatal vitamins and feels that her constipation has improved.    OB History    Gravida  5   Para  3   Term  2   Preterm  1   AB      Living  3     SAB      TAB      Ectopic      Multiple      Live Births  3           Past Medical History:  Diagnosis Date  . Asthma    seasonal  . Depression   . GERD (gastroesophageal reflux disease)   . LGSIL (low grade squamous intraepithelial lesion) on Pap smear 08/2004  . Migraines   . Vaginal Pap smear, abnormal     Past Surgical History:  Procedure Laterality Date  . CHOLECYSTECTOMY N/A 03/16/2017   Procedure: LAPAROSCOPIC CHOLECYSTECTOMY WITH INTRAOPERATIVE CHOLANGIOGRAM;  Surgeon: Luretha Murphy, MD;  Location: [facility];  Service: General;  Laterality: N/A;  . ERCP N/A 03/17/2017   Procedure: ENDOSCOPIC RETROGRADE CHOLANGIOPANCREATOGRAPHY (ERCP);  Surgeon: Meryl Dare, MD;  Location: [facility];  Service: Endoscopy;  Laterality: N/A;  . NASAL SINUS SURGERY    . nissenfundo plication  2005    Family History  Problem Relation Age of Onset  . Hypertension Mother   . Endometriosis Sister   . Cancer Maternal Grandmother   . Diabetes Maternal Grandfather     Social History   Tobacco Use  . Smoking status: Never Smoker  . Smokeless tobacco: Never Used  Substance Use Topics  . Alcohol use: Yes    Alcohol/week: 0.0 oz    Comment: occ  . Drug use: No    Allergies:  Allergies  Allergen  Reactions  . Nsaids Hives, Swelling and Other (See Comments)    Pt states that her lips swelled.    . Sulfa Antibiotics Swelling and Other (See Comments)    Pt states that her lips swelled.    . Latex Hives and Rash    No medications prior to admission.    Review of Systems  Constitutional: Negative for fever.  Gastrointestinal: Negative for abdominal pain, constipation, diarrhea, nausea and vomiting.  Genitourinary: Negative for dysuria, frequency, urgency, vaginal bleeding and vaginal discharge.  Neurological: Positive for dizziness and light-headedness. Negative for syncope.   Physical Exam   Blood pressure 138/84, pulse 79, temperature 98 F (36.7 C), temperature source Oral, resp. rate 19, weight 247 lb 4 oz (112.2 kg), SpO2 97 %, unknown if currently breastfeeding.  Physical Exam  Nursing note and vitals reviewed. Constitutional: She is oriented to person, place, and time. She appears well-developed and well-nourished. No distress.  HENT:  Head: Normocephalic and atraumatic.  Cardiovascular: Normal rate, regular rhythm and normal heart sounds.  No murmur heard. Respiratory: Effort normal and breath sounds normal. No respiratory distress. She has no wheezes.  GI: Soft. She exhibits no distension.  Neurological: She is alert and oriented to person, place, and time.  Skin: Skin is warm and dry. No erythema.  Psychiatric: She has a normal mood and affect.    Results for orders placed or performed during the hospital encounter of 06/14/18 (from the past 24 hour(s))  Urinalysis, Routine w reflex microscopic     Status: Abnormal   Collection Time: 06/14/18 11:30 AM  Result Value Ref Range   Color, Urine YELLOW YELLOW   APPearance CLEAR CLEAR   Specific Gravity, Urine 1.002 (L) 1.005 - 1.030   pH 7.0 5.0 - 8.0   Glucose, UA NEGATIVE NEGATIVE mg/dL   Hgb urine dipstick NEGATIVE NEGATIVE   Bilirubin Urine NEGATIVE NEGATIVE   Ketones, ur NEGATIVE NEGATIVE mg/dL   Protein,  ur NEGATIVE NEGATIVE mg/dL   Nitrite NEGATIVE NEGATIVE   Leukocytes, UA NEGATIVE NEGATIVE  CBC with Differential/Platelet     Status: None   Collection Time: 06/14/18 11:46 AM  Result Value Ref Range   WBC 8.7 4.0 - 10.5 K/uL   RBC 4.31 3.87 - 5.11 MIL/uL   Hemoglobin 12.9 12.0 - 15.0 g/dL   HCT 16.138.9 09.636.0 - 04.546.0 %   MCV 90.3 78.0 - 100.0 fL   MCH 29.9 26.0 - 34.0 pg   MCHC 33.2 30.0 - 36.0 g/dL   RDW 40.913.3 81.111.5 - 91.415.5 %   Platelets 230 150 - 400 K/uL   Neutrophils Relative % 74 %   Neutro Abs 6.5 1.7 - 7.7 K/uL   Lymphocytes Relative 23 %   Lymphs Abs 2.0 0.7 - 4.0 K/uL   Monocytes Relative 2 %   Monocytes Absolute 0.2 0.1 - 1.0 K/uL   Eosinophils Relative 1 %   Eosinophils Absolute 0.1 0.0 - 0.7 K/uL   Basophils Relative 0 %   Basophils Absolute 0.0 0.0 - 0.1 K/uL  Comprehensive metabolic panel     Status: Abnormal   Collection Time: 06/14/18 11:46 AM  Result Value Ref Range   Sodium 133 (L) 135 - 145 mmol/L   Potassium 3.8 3.5 - 5.1 mmol/L   Chloride 101 98 - 111 mmol/L   CO2 23 22 - 32 mmol/L   Glucose, Bld 90 70 - 99 mg/dL   BUN 8 6 - 20 mg/dL   Creatinine, Ser 7.820.59 0.44 - 1.00 mg/dL   Calcium 9.2 8.9 - 95.610.3 mg/dL   Total Protein 7.0 6.5 - 8.1 g/dL   Albumin 3.3 (L) 3.5 - 5.0 g/dL   AST 20 15 - 41 U/L   ALT 33 0 - 44 U/L   Alkaline Phosphatase 69 38 - 126 U/L   Total Bilirubin 0.5 0.3 - 1.2 mg/dL   GFR calc non Af Amer >60 >60 mL/min   GFR calc Af Amer >60 >60 mL/min   Anion gap 9 5 - 15   Orthostatic VS for the past 24 hrs (Last 3 readings):  BP- Lying Pulse- Lying BP- Sitting Pulse- Sitting BP- Standing at 0 minutes Pulse- Standing at 0 minutes BP- Standing at 3 minutes Pulse- Standing at 3 minutes  06/14/18 1157 - - - - - - 116/73 98  06/14/18 1154 - - - - 123/83 99 - -  06/14/18 1153 - - 124/77 84 - - - -  06/14/18 1151 122/75 86 - - - - - -    MAU Course  Procedures  MDM FHR - 140 bpm with doppler CBC, CMP, orthostatic VS and UA today  Discussed  patient with Dr. Claiborne Billingsallahan. Ok  for discharge at this time. May take [employer]/B6 for nausea if desired  Assessment and Plan  A:  SIUP at [redacted]w[redacted]d Maternal hypotension   P: Discharge home Increased PO hydration advised Discussed slow changes of position to avoid symptomatic changes in blood pressure  Warning signs for worsening condition discussed Patient advised to follow-up with [facility] as scheduled for routine prenatal care or sooner if symptoms worsen Patient may return to MAU as needed or if her condition were to change or worsen  Vonzella Nipple, PA-C 06/14/2018, 2:13 PM

## 2018-06-14 NOTE — MAU Note (Signed)
Around 9, she started feeling light headed and nauseated. Felt like she was going to pass out, vision was tunneling.  Called dr, was instructed to come in. Feeling, hot like really flushed.

## 2018-08-05 ENCOUNTER — Inpatient Hospital Stay (HOSPITAL_COMMUNITY)
Admission: AD | Admit: 2018-08-05 | Discharge: 2018-08-05 | Disposition: A | Discharge status: Home / Self Care | Payer: 59 | Source: Ambulatory Visit | Attending: Obstetrics | Admitting: Obstetrics

## 2018-08-05 ENCOUNTER — Other Ambulatory Visit: Payer: Self-pay

## 2018-08-05 ENCOUNTER — Encounter (HOSPITAL_COMMUNITY): Payer: Self-pay

## 2018-08-05 DIAGNOSIS — O212 Late vomiting of pregnancy: Secondary | ICD-10-CM

## 2018-08-05 DIAGNOSIS — O26893 Other specified pregnancy related conditions, third trimester: Secondary | ICD-10-CM | POA: Diagnosis not present

## 2018-08-05 DIAGNOSIS — R112 Nausea with vomiting, unspecified: Secondary | ICD-10-CM

## 2018-08-05 DIAGNOSIS — R197 Diarrhea, unspecified: Secondary | ICD-10-CM | POA: Diagnosis present

## 2018-08-05 DIAGNOSIS — E860 Dehydration: Secondary | ICD-10-CM | POA: Diagnosis not present

## 2018-08-05 DIAGNOSIS — Z3A28 28 weeks gestation of pregnancy: Secondary | ICD-10-CM | POA: Diagnosis not present

## 2018-08-05 DIAGNOSIS — K529 Noninfective gastroenteritis and colitis, unspecified: Secondary | ICD-10-CM

## 2018-08-05 LAB — COMPREHENSIVE METABOLIC PANEL
ALT: 27 U/L (ref 0–44)
ANION GAP: 10 (ref 5–15)
AST: 18 U/L (ref 15–41)
Albumin: 3.2 g/dL — ABNORMAL LOW (ref 3.5–5.0)
Alkaline Phosphatase: 109 U/L (ref 38–126)
BUN: 7 mg/dL (ref 6–20)
CHLORIDE: 103 mmol/L (ref 98–111)
CO2: 22 mmol/L (ref 22–32)
Calcium: 8.8 mg/dL — ABNORMAL LOW (ref 8.9–10.3)
Creatinine, Ser: 0.7 mg/dL (ref 0.44–1.00)
Glucose, Bld: 98 mg/dL (ref 70–99)
POTASSIUM: 3.7 mmol/L (ref 3.5–5.1)
Sodium: 135 mmol/L (ref 135–145)
Total Bilirubin: 0.6 mg/dL (ref 0.3–1.2)
Total Protein: 7.4 g/dL (ref 6.5–8.1)

## 2018-08-05 LAB — CBC
HCT: 39.3 % (ref 36.0–46.0)
Hemoglobin: 13.2 g/dL (ref 12.0–15.0)
MCH: 29.9 pg (ref 26.0–34.0)
MCHC: 33.6 g/dL (ref 30.0–36.0)
MCV: 89.1 fL (ref 78.0–100.0)
PLATELETS: 189 10*3/uL (ref 150–400)
RBC: 4.41 MIL/uL (ref 3.87–5.11)
RDW: 13.3 % (ref 11.5–15.5)
WBC: 8.5 10*3/uL (ref 4.0–10.5)

## 2018-08-05 MED ORDER — LACTATED RINGERS IV SOLN
INTRAVENOUS | Status: DC
  Administered 2018-08-05 (×2): via INTRAVENOUS

## 2018-08-05 MED ORDER — PROMETHAZINE HCL 25 MG PO TABS: 25.0000 mg | ORAL_TABLET | Freq: Four times a day (QID) | ORAL | 2 refills | Status: DC | PRN

## 2018-08-05 MED ORDER — SODIUM CHLORIDE 0.9 % IV SOLN
25.0000 mg | Freq: Once | INTRAVENOUS | Status: AC
  Administered 2018-08-05: 25 mg via INTRAVENOUS
  Filled 2018-08-05: qty 1

## 2018-08-05 NOTE — MAU Provider Note (Signed)
Chief Complaint:  Nausea; Emesis; Diarrhea; and Dizziness   First Provider Initiated Contact with Patient 08/05/18 1246     HPI: [name] is a 37 y.o. [phone] at 2428w2dwho presents to maternity admissions reporting nausea, vomiting and diarrhea since this morning.  Other family members have had it also. . She reports good fetal movement, denies LOF, vaginal bleeding, vaginal itching/burning, urinary symptoms, h/a, dizziness, n/v, diarrhea, constipation or fever/chills.  She denies headache, visual changes or RUQ abdominal pain.  Has a history of nissofundiplication and cholecystectomy.  Emesis   This is a new problem. The current episode started today. The problem has been unchanged. There has been no fever. Associated symptoms include abdominal pain (crampy), diarrhea and dizziness. Pertinent negatives include no chills, fever, headaches or myalgias. Risk factors include ill contacts.  Diarrhea   This is a new problem. The current episode started today. The problem has been unchanged. Associated symptoms include abdominal pain (crampy) and vomiting. Pertinent negatives include no chills, fever, headaches or myalgias. Nothing aggravates the symptoms. Risk factors include ill contacts.  Dizziness  This is a new problem. The current episode started today. Associated symptoms include abdominal pain (crampy), nausea and vomiting. Pertinent negatives include no chills, fever, headaches or myalgias. Nothing aggravates the symptoms. She has tried nothing for the symptoms.    RN Note: Had a stomach virus about the last 12 hrs.  Nausea, diarrhea, light headed.  Went to urgent care, they sent her over here, said she needs fluids (hasn't been peeing) and they had no way to check the baby  Past Medical History: Past Medical History:  Diagnosis Date  . Asthma    seasonal  . Depression   . GERD (gastroesophageal reflux disease)   . LGSIL (low grade squamous intraepithelial lesion) on Pap smear  08/2004  . Migraines   . Vaginal Pap smear, abnormal     Past obstetric history: OB History  Gravida Para Term Preterm AB Living  5 3 2 1   3   SAB TAB Ectopic Multiple Live Births          3    # Outcome Date GA Lbr Len/2nd Weight Sex Delivery Anes PTL Lv  5 Current           4 Term 12/11/15 6454w0d 01:08 / 00:10 3260 g M Vag-Spont EPI  LIV  3 Gravida           2 Term      Vag-Spont     1 Preterm      Vag-Spont       Past Surgical History: Past Surgical History:  Procedure Laterality Date  . CHOLECYSTECTOMY N/A 03/16/2017   Procedure: LAPAROSCOPIC CHOLECYSTECTOMY WITH INTRAOPERATIVE CHOLANGIOGRAM;  Surgeon: Luretha MurphyMatthew Martin, MD;  Location: [facility];  Service: General;  Laterality: N/A;  . ERCP N/A 03/17/2017   Procedure: ENDOSCOPIC RETROGRADE CHOLANGIOPANCREATOGRAPHY (ERCP);  Surgeon: Meryl DareMalcolm T Stark, MD;  [facility];  Service: Endoscopy;  Laterality: N/A;  . NASAL SINUS SURGERY    . nissenfundo plication  2005    Family History: Family History  Problem Relation Age of Onset  . Hypertension Mother   . Endometriosis Sister   . Cancer Maternal Grandmother   . Diabetes Maternal Grandfather     Social History: Social History   Tobacco Use  . Smoking status: Never Smoker  . Smokeless tobacco: Never Used  Substance Use Topics  . Alcohol use: Yes    Alcohol/week: 0.0 standard drinks  Comment: occ  . Drug use: No    Allergies:  Allergies  Allergen Reactions  . Nsaids Hives, Swelling and Other (See Comments)    Pt states that her lips swelled.    . Sulfa Antibiotics Swelling and Other (See Comments)    Pt states that her lips swelled.    . Latex Hives and Rash    Meds:  Medications Prior to Admission  Medication Sig Dispense Refill Last Dose  . albuterol (PROVENTIL HFA;VENTOLIN HFA) 108 (90 Base) MCG/ACT inhaler Inhale 2 puffs every 4 (four) hours as needed into the lungs for wheezing or shortness of breath.    More than one month ago  . Prenatal Vit-Fe  Fumarate-FA (PRENATAL MULTIVITAMIN) TABS tablet Take 1 tablet daily at 12 noon by mouth.   10/20/2017 at Unknown time    I have reviewed patient's Past Medical Hx, Surgical Hx, Family Hx, Social Hx, medications and allergies.   ROS:  Review of Systems  Constitutional: Negative for chills and fever.  Gastrointestinal: Positive for abdominal pain (crampy), diarrhea, nausea and vomiting.  Musculoskeletal: Negative for myalgias.  Neurological: Positive for dizziness. Negative for headaches.   Other systems negative  Physical Exam   Patient Vitals for the past 24 hrs:  BP Temp Temp src Pulse Resp SpO2 Weight  08/05/18 1222 119/70 98.6 F (37 C) Oral (!) 113 18 97 % 114 kg   Constitutional: Well-developed, well-nourished female in no acute distress.  Cardiovascular: normal rate and rhythm Respiratory: normal effort, clear to auscultation bilaterally GI: Abd soft, non-tender, gravid appropriate for gestational age.   No rebound or guarding. MS: Extremities nontender, no edema, normal ROM Neurologic: Alert and oriented x 4.  GU: Neg CVAT.  PELVIC EXAM: deferred  FHT:  Baseline 145 , moderate variability, accelerations present, no decelerations Contractions:  Irregular irritability   Labs: Results for orders placed or performed during the hospital encounter of 08/05/18 (from the past 24 hour(s))  CBC     Status: None   Collection Time: 08/05/18  1:09 PM  Result Value Ref Range   WBC 8.5 4.0 - 10.5 K/uL   RBC 4.41 3.87 - 5.11 MIL/uL   Hemoglobin 13.2 12.0 - 15.0 g/dL   HCT 13.2 44.0 - 10.2 %   MCV 89.1 78.0 - 100.0 fL   MCH 29.9 26.0 - 34.0 pg   MCHC 33.6 30.0 - 36.0 g/dL   RDW 72.5 36.6 - 44.0 %   Platelets 189 150 - 400 K/uL  Comprehensive metabolic panel     Status: Abnormal   Collection Time: 08/05/18  1:58 PM  Result Value Ref Range   Sodium 135 135 - 145 mmol/L   Potassium 3.7 3.5 - 5.1 mmol/L   Chloride 103 98 - 111 mmol/L   CO2 22 22 - 32 mmol/L   Glucose, Bld 98  70 - 99 mg/dL   BUN 7 6 - 20 mg/dL   Creatinine, Ser 3.47 0.44 - 1.00 mg/dL   Calcium 8.8 (L) 8.9 - 10.3 mg/dL   Total Protein 7.4 6.5 - 8.1 g/dL   Albumin 3.2 (L) 3.5 - 5.0 g/dL   AST 18 15 - 41 U/L   ALT 27 0 - 44 U/L   Alkaline Phosphatase 109 38 - 126 U/L   Total Bilirubin 0.6 0.3 - 1.2 mg/dL   GFR calc non Af Amer >60 >60 mL/min   GFR calc Af Amer >60 >60 mL/min   Anion gap 10 5 - 15    --/--/  A POS (11/04 1404)  Imaging:  No results found.  MAU Course/MDM: I have ordered labs and reviewed results.  Checked CBC to see if she had leukocytosis, which she does not.  CMET is also normal NST reviewed, reactive Consult Dr Chestine Sporelark with presentation, exam findings and test results.  Treatments in MAU included 3 liters of IV fluid, Phenergan.  She was well hydrated and able to keep down PO challenge afterward.  States feels better.    Assessment: Single Intrauterine pregnancy at 5251w2d Probable viral gastroenteritis Moderate dehydration  Plan: Discharge home Supportive care Advance diet as tolerated Rx Phenergan for nausea Preterm Labor precautions and fetal kick counts Follow up in Office for prenatal visits and recheck of status  Encouraged to return here or to other Urgent Care/ED if she develops worsening of symptoms, increase in pain, fever, or other concerning symptoms.   Pt stable at time of discharge.  Wynelle BourgeoisMarie Eduardo Wurth CNM, MSN Certified Nurse-Midwife 08/05/2018 12:47 PM

## 2018-08-05 NOTE — MAU Note (Signed)
Had a stomach virus about the last 12 hrs.  Nausea, diarrhea, light headed.  Went to urgent care, they sent her over here, said she needs fluids (hasn't been peeing) and they had no way to check the baby.

## 2018-08-05 NOTE — Discharge Instructions (Signed)

## 2018-10-01 LAB — OB RESULTS CONSOLE GBS: STREP GROUP B AG: NEGATIVE

## 2018-10-10 ENCOUNTER — Other Ambulatory Visit: Payer: Self-pay

## 2018-10-10 ENCOUNTER — Other Ambulatory Visit: Payer: Self-pay | Admitting: Obstetrics and Gynecology

## 2018-10-10 ENCOUNTER — Encounter (HOSPITAL_COMMUNITY): Payer: Self-pay | Admitting: *Deleted

## 2018-10-10 ENCOUNTER — Inpatient Hospital Stay (HOSPITAL_COMMUNITY)
Admission: AD | Admit: 2018-10-10 | Discharge: 2018-10-10 | Disposition: A | Discharge status: Home / Self Care | Payer: 59 | Source: Ambulatory Visit | Attending: Obstetrics | Admitting: Obstetrics

## 2018-10-10 DIAGNOSIS — Z3A37 37 weeks gestation of pregnancy: Secondary | ICD-10-CM | POA: Diagnosis not present

## 2018-10-10 DIAGNOSIS — O368130 Decreased fetal movements, third trimester, not applicable or unspecified: Secondary | ICD-10-CM | POA: Diagnosis present

## 2018-10-10 DIAGNOSIS — Z3689 Encounter for other specified antenatal screening: Secondary | ICD-10-CM

## 2018-10-10 NOTE — MAU Provider Note (Signed)
Chief Complaint:  Decreased Fetal Movement   First Provider Initiated Contact with Patient 10/10/18 [phone]     HPI: [name] is a 37 y.o. [phone] at 54w5dwho presents to maternity admissions reporting decreased fetal movement since 4am.   Has had irregular contractions.. She denies LOF, vaginal bleeding, vaginal itching/burning, urinary symptoms, h/a, dizziness, n/v, diarrhea, constipation or fever/chills.  She denies headache, visual changes or RUQ abdominal pain.  Other  This is a new (decreased fetal movement) problem. The current episode started today. The problem occurs intermittently. The problem has been gradually improving. Pertinent negatives include no abdominal pain, chills, fever, nausea or vomiting. Nothing aggravates the symptoms. She has tried nothing for the symptoms.    RN Note: Pt presents to MAU c/o DFM since 0400. Pt states she woke up at 0300 with contractions that were regular that since then they have become irregular. Pt denies LOF or bleeding.   Past Medical History: Past Medical History:  Diagnosis Date  . Asthma    seasonal  . Depression   . GERD (gastroesophageal reflux disease)   . LGSIL (low grade squamous intraepithelial lesion) on Pap smear 08/2004  . Migraines   . Vaginal Pap smear, abnormal     Past obstetric history: OB History  Gravida Para Term Preterm AB Living  5 3 2 1   3   SAB TAB Ectopic Multiple Live Births          3    # Outcome Date GA Lbr Len/2nd Weight Sex Delivery Anes PTL Lv  5 Current           4 Term 12/11/15 [redacted]w[redacted]d 01:08 / 00:10 3260 g M Vag-Spont EPI  LIV  3 Gravida           2 Term      Vag-Spont     1 Preterm      Vag-Spont       Past Surgical History: Past Surgical History:  Procedure Laterality Date  . CHOLECYSTECTOMY N/A 03/16/2017   Procedure: LAPAROSCOPIC CHOLECYSTECTOMY WITH INTRAOPERATIVE CHOLANGIOGRAM;  Surgeon: Luretha Murphy, MD;  Location: [facility];  Service: General;  Laterality: N/A;  . ERCP N/A  03/17/2017   Procedure: ENDOSCOPIC RETROGRADE CHOLANGIOPANCREATOGRAPHY (ERCP);  Surgeon: Meryl Dare, MD;  Location[facility];  Service: Endoscopy;  Laterality: N/A;  . NASAL SINUS SURGERY    . nissenfundo plication  2005    Family History: Family History  Problem Relation Age of Onset  . Hypertension Mother   . Endometriosis Sister   . Cancer Maternal Grandmother   . Diabetes Maternal Grandfather     Social History: Social History   Tobacco Use  . Smoking status: Never Smoker  . Smokeless tobacco: Never Used  Substance Use Topics  . Alcohol use: Yes    Alcohol/week: 0.0 standard drinks    Comment: occ  . Drug use: No    Allergies:  Allergies  Allergen Reactions  . Nsaids Hives, Swelling and Other (See Comments)    Pt states that her lips swelled.    . Sulfa Antibiotics Swelling and Other (See Comments)    Pt states that her lips swelled.    . Latex Hives and Rash    Meds:  Medications Prior to Admission  Medication Sig Dispense Refill Last Dose  . albuterol (PROVENTIL HFA;VENTOLIN HFA) 108 (90 Base) MCG/ACT inhaler Inhale 2 puffs every 4 (four) hours as needed into the lungs for wheezing or shortness of breath.    More than  one month ago  . Prenatal Vit-Fe Fumarate-FA (PRENATAL MULTIVITAMIN) TABS tablet Take 1 tablet daily at 12 noon by mouth.   10/20/2017 at Unknown time  . promethazine (PHENERGAN) 25 MG tablet Take 1 tablet (25 mg total) by mouth every 6 (six) hours as needed for nausea or vomiting. 30 tablet 2     I have reviewed patient's Past Medical Hx, Surgical Hx, Family Hx, Social Hx, medications and allergies.   ROS:  Review of Systems  Constitutional: Negative for chills and fever.  Gastrointestinal: Negative for abdominal pain, nausea and vomiting.   Other systems negative  Physical Exam   Patient Vitals for the past 24 hrs:  BP Temp Temp src Pulse Resp Height Weight  10/10/18 0647 114/86 98.5 F (36.9 C) Oral (!) 103 18 5\' 6"  (1.676 m)  116.6 kg   Constitutional: Well-developed, well-nourished female in no acute distress.  Cardiovascular: normal rate and rhythm Respiratory: normal effort, clear to auscultation bilaterally GI: Abd soft, non-tender, gravid appropriate for gestational age.   No rebound or guarding. MS: Extremities nontender, no edema, normal ROM Neurologic: Alert and oriented x 4.  GU: Neg CVAT.  PELVIC EXAM:  Cervix 1/thick per RN  FHT:  Baseline 135 , moderate variability, accelerations present, no decelerations Contractions: q 4 mins Irregular, mild   Labs: No results found for this or any previous visit (from the past 24 hour(s)).  --/--/A POS (11/04 1404)  Imaging:  No results found.  MAU Course/MDM: NST reviewed and is reactive.  UCs are in a pattern but are very mild and cervix has not changed.  Treatments in MAU included EFM.    Assessment: Single intrauterine pregnancy at [redacted]w[redacted]d Decreased fetal movement Reactive nonstress test  Plan: Discharge home Labor precautions and fetal kick counts Follow up in Office for prenatal visits and recheck status  Encouraged to return here or to other Urgent Care/ED if she develops worsening of symptoms, increase in pain, fever, or other concerning symptoms.   Pt stable at time of discharge.  Wynelle Bourgeois CNM, MSN Certified Nurse-Midwife 10/10/2018 6:48 AM

## 2018-10-10 NOTE — MAU Note (Signed)
Pt presents to MAU c/o DFM since 0400. Pt states she woke up at 0300 with contractions that were regular that since then they have become irregular. Pt denies LOF or bleeding.

## 2018-10-10 NOTE — Discharge Instructions (Signed)
Fetal Movement Counts °Patient Name: ________________________________________________ Patient Due Date: ____________________ °What is a fetal movement count? °A fetal movement count is the number of times that you feel your baby move during a certain amount of time. This may also be called a fetal kick count. A fetal movement count is recommended for every pregnant woman. You may be asked to start counting fetal movements as early as week 28 of your pregnancy. °Pay attention to when your baby is most active. You may notice your baby's sleep and wake cycles. You may also notice things that make your baby move more. You should do a fetal movement count: °· When your baby is normally most active. °· At the same time each day. ° °A good time to count movements is while you are resting, after having something to eat and drink. °How do I count fetal movements? °1. Find a quiet, comfortable area. Sit, or lie down on your side. °2. Write down the date, the start time and stop time, and the number of movements that you felt between those two times. Take this information with you to your health care visits. °3. For 2 hours, count kicks, flutters, swishes, rolls, and jabs. You should feel at least 10 movements during 2 hours. °4. You may stop counting after you have felt 10 movements. °5. If you do not feel 10 movements in 2 hours, have something to eat and drink. Then, keep resting and counting for 1 hour. If you feel at least 4 movements during that hour, you may stop counting. °Contact a health care provider if: °· You feel fewer than 4 movements in 2 hours. °· Your baby is not moving like he or she usually does. °Date: ____________ Start time: ____________ Stop time: ____________ Movements: ____________ °Date: ____________ Start time: ____________ Stop time: ____________ Movements: ____________ °Date: ____________ Start time: ____________ Stop time: ____________ Movements: ____________ °Date: ____________ Start time:  ____________ Stop time: ____________ Movements: ____________ °Date: ____________ Start time: ____________ Stop time: ____________ Movements: ____________ °Date: ____________ Start time: ____________ Stop time: ____________ Movements: ____________ °Date: ____________ Start time: ____________ Stop time: ____________ Movements: ____________ °Date: ____________ Start time: ____________ Stop time: ____________ Movements: ____________ °Date: ____________ Start time: ____________ Stop time: ____________ Movements: ____________ °This information is not intended to replace advice given to you by your health care provider. Make sure you discuss any questions you have with your health care provider. °Document Released: 01/03/2007 Document Revised: 08/02/2016 Document Reviewed: 01/13/2016 °Elsevier Interactive Patient Education © 2018 [employer] ° °Third Trimester of Pregnancy °The third trimester is from week 29 through week 42, months 7 through 9. This trimester is when your unborn baby (fetus) is growing very fast. At the end of the ninth month, the unborn baby is about 20 inches in length. It weighs about 6-10 pounds. °Follow these instructions at home: °· Avoid all smoking, herbs, and alcohol. Avoid drugs not approved by your doctor. °· Do not use any tobacco products, including cigarettes, chewing tobacco, and electronic cigarettes. If you need help quitting, ask your doctor. You may get counseling or other support to help you quit. °· Only take medicine as told by your doctor. Some medicines are safe and some are not during pregnancy. °· Exercise only as told by your doctor. Stop exercising if you start having cramps. °· Eat regular, healthy meals. °· Wear a good support bra if your breasts are tender. °· Do not use hot tubs, steam rooms, or saunas. °· Wear your seat belt   when driving. °· Avoid raw meat, uncooked cheese, and liter boxes and soil used by cats. °· Take your prenatal vitamins. °· Take 1500-2000  milligrams of calcium daily starting at the 20th week of pregnancy until you deliver your baby. °· Try taking medicine that helps you poop (stool softener) as needed, and if your doctor approves. Eat more fiber by eating fresh fruit, vegetables, and whole grains. Drink enough fluids to keep your pee (urine) clear or pale yellow. °· Take warm water baths (sitz baths) to soothe pain or discomfort caused by hemorrhoids. Use hemorrhoid cream if your doctor approves. °· If you have puffy, bulging veins (varicose veins), wear support hose. Raise (elevate) your feet for 15 minutes, 3-4 times a day. Limit salt in your diet. °· Avoid heavy lifting, wear low heels, and sit up straight. °· Rest with your legs raised if you have leg cramps or low back pain. °· Visit your dentist if you have not gone during your pregnancy. Use a soft toothbrush to brush your teeth. Be gentle when you floss. °· You can have sex (intercourse) unless your doctor tells you not to. °· Do not travel far distances unless you must. Only do so with your doctor's approval. °· Take prenatal classes. °· Practice driving to the hospital. °· Pack your hospital bag. °· Prepare the baby's room. °· Go to your doctor visits. °Get help if: °· You are not sure if you are in labor or if your water has broken. °· You are dizzy. °· You have mild cramps or pressure in your lower belly (abdominal). °· You have a nagging pain in your belly area. °· You continue to feel sick to your stomach (nauseous), throw up (vomit), or have watery poop (diarrhea). °· You have bad smelling fluid coming from your vagina. °· You have pain with peeing (urination). °Get help right away if: °· You have a fever. °· You are leaking fluid from your vagina. °· You are spotting or bleeding from your vagina. °· You have severe belly cramping or pain. °· You lose or gain weight rapidly. °· You have trouble catching your breath and have chest pain. °· You notice sudden or extreme puffiness  (swelling) of your face, hands, ankles, feet, or legs. °· You have not felt the baby move in over an hour. °· You have severe headaches that do not go away with medicine. °· You have vision changes. °This information is not intended to replace advice given to you by your health care provider. Make sure you discuss any questions you have with your health care provider. °Document Released: 02/28/2010 Document Revised: 05/11/2016 Document Reviewed: 02/04/2013 °Elsevier Interactive Patient Education © 2017 [employer] ° °

## 2018-10-11 ENCOUNTER — Telehealth (HOSPITAL_COMMUNITY): Payer: Self-pay | Admitting: *Deleted

## 2018-10-11 NOTE — Telephone Encounter (Signed)
Preadmission screen  

## 2018-10-21 ENCOUNTER — Encounter (HOSPITAL_COMMUNITY): Payer: Self-pay | Admitting: *Deleted

## 2018-10-21 ENCOUNTER — Inpatient Hospital Stay (HOSPITAL_COMMUNITY): Admission: RE | Admit: 2018-10-21 | Payer: Medicaid Other | Source: Ambulatory Visit

## 2018-10-21 ENCOUNTER — Inpatient Hospital Stay (HOSPITAL_COMMUNITY): Payer: 59 | Admitting: Anesthesiology

## 2018-10-21 ENCOUNTER — Inpatient Hospital Stay (HOSPITAL_COMMUNITY)
Admission: AD | Admit: 2018-10-21 | Discharge: 2018-10-22 | DRG: 807 | Disposition: A | Discharge status: Home / Self Care | Payer: 59 | Attending: Obstetrics and Gynecology | Admitting: Obstetrics and Gynecology

## 2018-10-21 DIAGNOSIS — O09529 Supervision of elderly multigravida, unspecified trimester: Secondary | ICD-10-CM

## 2018-10-21 DIAGNOSIS — O99214 Obesity complicating childbirth: Secondary | ICD-10-CM | POA: Diagnosis present

## 2018-10-21 DIAGNOSIS — Z3A39 39 weeks gestation of pregnancy: Secondary | ICD-10-CM | POA: Diagnosis not present

## 2018-10-21 DIAGNOSIS — Z3483 Encounter for supervision of other normal pregnancy, third trimester: Secondary | ICD-10-CM | POA: Diagnosis present

## 2018-10-21 LAB — TYPE AND SCREEN
ABO/RH(D): A POS
ANTIBODY SCREEN: NEGATIVE

## 2018-10-21 LAB — CBC
HEMATOCRIT: 36.5 % (ref 36.0–46.0)
HEMOGLOBIN: 11.9 g/dL — AB (ref 12.0–15.0)
MCH: 28.5 pg (ref 26.0–34.0)
MCHC: 32.6 g/dL (ref 30.0–36.0)
MCV: 87.5 fL (ref 80.0–100.0)
Platelets: 211 10*3/uL (ref 150–400)
RBC: 4.17 MIL/uL (ref 3.87–5.11)
RDW: 13.7 % (ref 11.5–15.5)
WBC: 9.3 10*3/uL (ref 4.0–10.5)

## 2018-10-21 LAB — RPR: RPR: NONREACTIVE

## 2018-10-21 LAB — POCT FERN TEST: POCT Fern Test: POSITIVE

## 2018-10-21 MED ORDER — LACTATED RINGERS IV SOLN
INTRAVENOUS | Status: DC
  Administered 2018-10-21 (×3): via INTRAVENOUS

## 2018-10-21 MED ORDER — LIDOCAINE HCL (PF) 1 % IJ SOLN
30.0000 mL | INTRAMUSCULAR | Status: DC | PRN
  Filled 2018-10-21: qty 30

## 2018-10-21 MED ORDER — PHENYLEPHRINE 40 MCG/ML (10ML) SYRINGE FOR IV PUSH (FOR BLOOD PRESSURE SUPPORT)
80.0000 ug | PREFILLED_SYRINGE | INTRAVENOUS | Status: DC | PRN
  Filled 2018-10-21: qty 5
  Filled 2018-10-21 (×2): qty 10

## 2018-10-21 MED ORDER — TETANUS-DIPHTH-ACELL PERTUSSIS 5-2.5-18.5 LF-MCG/0.5 IM SUSP: 0.5000 mL | Freq: Once | INTRAMUSCULAR | Status: DC

## 2018-10-21 MED ORDER — DIPHENHYDRAMINE HCL 25 MG PO CAPS: 25.0000 mg | ORAL_CAPSULE | Freq: Four times a day (QID) | ORAL | Status: DC | PRN

## 2018-10-21 MED ORDER — LACTATED RINGERS IV SOLN
500.0000 mL | Freq: Once | INTRAVENOUS | Status: AC
  Administered 2018-10-21: 500 mL via INTRAVENOUS

## 2018-10-21 MED ORDER — SOD CITRATE-CITRIC ACID 500-334 MG/5ML PO SOLN: 30.0000 mL | ORAL | Status: DC | PRN

## 2018-10-21 MED ORDER — OXYCODONE-ACETAMINOPHEN 5-325 MG PO TABS: 1.0000 | ORAL_TABLET | ORAL | Status: DC | PRN

## 2018-10-21 MED ORDER — ACETAMINOPHEN 325 MG PO TABS
650.0000 mg | ORAL_TABLET | ORAL | Status: DC | PRN
  Administered 2018-10-21 – 2018-10-22 (×3): 650 mg via ORAL

## 2018-10-21 MED ORDER — OXYCODONE-ACETAMINOPHEN 5-325 MG PO TABS: 2.0000 | ORAL_TABLET | ORAL | Status: DC | PRN

## 2018-10-21 MED ORDER — FENTANYL CITRATE (PF) 100 MCG/2ML IJ SOLN: 50.0000 ug | INTRAMUSCULAR | Status: DC | PRN

## 2018-10-21 MED ORDER — ZOLPIDEM TARTRATE 5 MG PO TABS: 5.0000 mg | ORAL_TABLET | Freq: Every evening | ORAL | Status: DC | PRN

## 2018-10-21 MED ORDER — OXYTOCIN 40 UNITS IN LACTATED RINGERS INFUSION - SIMPLE MED
2.5000 [IU]/h | INTRAVENOUS | Status: DC
  Filled 2018-10-21: qty 1000

## 2018-10-21 MED ORDER — LIDOCAINE HCL (PF) 1 % IJ SOLN
INTRAMUSCULAR | Status: DC | PRN
  Administered 2018-10-21 (×2): 4 mL via EPIDURAL

## 2018-10-21 MED ORDER — SIMETHICONE 80 MG PO CHEW
80.0000 mg | CHEWABLE_TABLET | ORAL | Status: DC | PRN
  Administered 2018-10-22: 80 mg via ORAL

## 2018-10-21 MED ORDER — ONDANSETRON HCL 4 MG/2ML IJ SOLN
4.0000 mg | Freq: Four times a day (QID) | INTRAMUSCULAR | Status: DC | PRN
  Administered 2018-10-21: 4 mg via INTRAVENOUS
  Filled 2018-10-21: qty 2

## 2018-10-21 MED ORDER — COCONUT OIL OIL: 1.0000 "application " | TOPICAL_OIL | Status: DC | PRN

## 2018-10-21 MED ORDER — OXYCODONE-ACETAMINOPHEN 5-325 MG PO TABS
1.0000 | ORAL_TABLET | ORAL | Status: DC | PRN
  Administered 2018-10-22 (×2): 1 via ORAL
  Filled 2018-10-21 (×2): qty 1

## 2018-10-21 MED ORDER — TERBUTALINE SULFATE 1 MG/ML IJ SOLN
0.2500 mg | Freq: Once | INTRAMUSCULAR | Status: DC | PRN
  Filled 2018-10-21: qty 1

## 2018-10-21 MED ORDER — ACETAMINOPHEN 325 MG PO TABS
650.0000 mg | ORAL_TABLET | ORAL | Status: DC | PRN
  Filled 2018-10-21 (×3): qty 2

## 2018-10-21 MED ORDER — OXYTOCIN 40 UNITS IN LACTATED RINGERS INFUSION - SIMPLE MED
1.0000 m[IU]/min | INTRAVENOUS | Status: DC
  Administered 2018-10-21: 2 m[IU]/min via INTRAVENOUS

## 2018-10-21 MED ORDER — PRENATAL MULTIVITAMIN CH
1.0000 | ORAL_TABLET | Freq: Every day | ORAL | Status: DC
  Administered 2018-10-22: 1 via ORAL
  Filled 2018-10-21 (×2): qty 1

## 2018-10-21 MED ORDER — EPHEDRINE 5 MG/ML INJ
10.0000 mg | INTRAVENOUS | Status: DC | PRN
  Filled 2018-10-21: qty 2

## 2018-10-21 MED ORDER — FENTANYL 2.5 MCG/ML BUPIVACAINE 1/10 % EPIDURAL INFUSION (WH - ANES)
14.0000 mL/h | INTRAMUSCULAR | Status: DC | PRN
  Administered 2018-10-21: 14 mL/h via EPIDURAL
  Filled 2018-10-21: qty 100

## 2018-10-21 MED ORDER — BENZOCAINE-MENTHOL 20-0.5 % EX AERO
1.0000 "application " | INHALATION_SPRAY | CUTANEOUS | Status: DC | PRN
  Administered 2018-10-21: 1 via TOPICAL
  Filled 2018-10-21 (×2): qty 56

## 2018-10-21 MED ORDER — PHENYLEPHRINE 40 MCG/ML (10ML) SYRINGE FOR IV PUSH (FOR BLOOD PRESSURE SUPPORT)
80.0000 ug | PREFILLED_SYRINGE | INTRAVENOUS | Status: AC | PRN
  Administered 2018-10-21 (×3): 80 ug via INTRAVENOUS

## 2018-10-21 MED ORDER — ONDANSETRON HCL 4 MG/2ML IJ SOLN: 4.0000 mg | INTRAMUSCULAR | Status: DC | PRN

## 2018-10-21 MED ORDER — WITCH HAZEL-GLYCERIN EX PADS: 1.0000 "application " | MEDICATED_PAD | CUTANEOUS | Status: DC | PRN

## 2018-10-21 MED ORDER — OXYTOCIN BOLUS FROM INFUSION
500.0000 mL | Freq: Once | INTRAVENOUS | Status: AC
  Administered 2018-10-21: 500 mL via INTRAVENOUS

## 2018-10-21 MED ORDER — ONDANSETRON HCL 4 MG PO TABS: 4.0000 mg | ORAL_TABLET | ORAL | Status: DC | PRN

## 2018-10-21 MED ORDER — SENNOSIDES-DOCUSATE SODIUM 8.6-50 MG PO TABS
2.0000 | ORAL_TABLET | ORAL | Status: DC
  Administered 2018-10-21: 2 via ORAL
  Filled 2018-10-21: qty 2

## 2018-10-21 MED ORDER — DIPHENHYDRAMINE HCL 50 MG/ML IJ SOLN: 12.5000 mg | INTRAMUSCULAR | Status: DC | PRN

## 2018-10-21 MED ORDER — LACTATED RINGERS IV SOLN: 500.0000 mL | INTRAVENOUS | Status: DC | PRN

## 2018-10-21 MED ORDER — DIBUCAINE 1 % RE OINT: 1.0000 "application " | TOPICAL_OINTMENT | RECTAL | Status: DC | PRN

## 2018-10-21 NOTE — Anesthesia Pain Management Evaluation Note (Signed)
  [facility] Pain Management Visit Note  Patient: [name], 37 y.o., female  "Hello I am a member of the anesthesia team at [facility]. We have an anesthesia team available at all times to provide care throughout the hospital, including epidural management and anesthesia for C-section. I don't know your plan for the delivery whether it a natural birth, water birth, IV sedation, nitrous supplementation, doula or epidural, but we want to meet your pain goals."   1.Was your pain managed to your expectations on prior hospitalizations?   Yes   2.What is your expectation for pain management during this hospitalization?     Epidural  3.How can we help you reach that goal? epidural  Record the patient's initial score and the patient's pain goal.   Pain: 7  Pain Goal: 7 [facility] wants you to be able to say your pain was always managed very well.  Gayla Benn 10/21/2018

## 2018-10-21 NOTE — H&P (Signed)
[name] is a 37 y.o. female presenting for leaking fluid  37 yo [id number] @ 39+2 presents for leaking fluid. She was confirmed to have SROMd in MAU. She was scheduled for IOL today for AMA. OB History    Gravida  5   Para  3   Term  2   Preterm  1   AB      Living  3     SAB      TAB      Ectopic      Multiple      Live Births  3          Past Medical History:  Diagnosis Date  . Asthma    seasonal  . Depression   . GERD (gastroesophageal reflux disease)   . LGSIL (low grade squamous intraepithelial lesion) on Pap smear 08/2004  . Migraines   . Vaginal Pap smear, abnormal    Past Surgical History:  Procedure Laterality Date  . CHOLECYSTECTOMY N/A 03/16/2017   Procedure: LAPAROSCOPIC CHOLECYSTECTOMY WITH INTRAOPERATIVE CHOLANGIOGRAM;  Surgeon: Luretha Murphy, MD;  Location: [facility];  Service: General;  Laterality: N/A;  . ERCP N/A 03/17/2017   Procedure: ENDOSCOPIC RETROGRADE CHOLANGIOPANCREATOGRAPHY (ERCP);  Surgeon: Meryl Dare, MD;  Location: [facility];  Service: Endoscopy;  Laterality: N/A;  . NASAL SINUS SURGERY    . nissenfundo plication  2005   Family History: family history includes Cancer in her maternal grandmother; Diabetes in her maternal grandfather; Endometriosis in her sister; Hypertension in her mother. Social History:  reports that she has never smoked. She has never used smokeless tobacco. She reports that she drank alcohol. She reports that she does not use drugs.     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 rate 18, height 5\' 6"  (1.676 m), weight 118.8 kg, SpO2 100 %, unknown if currently breastfeeding. Exam Physical Exam  Prenatal labs: ABO, Rh: --/--/A POS (11/04 0321) Antibody: NEG (11/04 0321) Rubella: Immune (04/18 0000) RPR: Nonreactive (04/18 0000)  HBsAg: Negative (04/18 0000)  HIV: Non-reactive (04/18 0000)  GBS: Negative (10/15 0000)   Assessment/Plan: 1) Admit 2) Epidural on request 3) Pitocing augmentation as needed   Waynard Reeds 10/21/2018, 8:36 AM

## 2018-10-21 NOTE — Anesthesia Preprocedure Evaluation (Signed)
Anesthesia Evaluation  Patient identified by MRN, date of birth, ID band Patient awake    Reviewed: Allergy & Precautions, Patient's Chart, lab work & pertinent test results  Airway Mallampati: II  TM Distance: >3 FB Neck ROM: Full    Dental no notable dental hx. (+) Teeth Intact   Pulmonary asthma ,    Pulmonary exam normal breath sounds clear to auscultation       Cardiovascular negative cardio ROS Normal cardiovascular exam Rhythm:Regular Rate:Normal     Neuro/Psych  Headaches, PSYCHIATRIC DISORDERS Depression    GI/Hepatic Neg liver ROS, GERD  Medicated and Controlled,  Endo/Other  Morbid obesity  Renal/GU negative Renal ROS  negative genitourinary   Musculoskeletal negative musculoskeletal ROS (+)   Abdominal (+) + obese,   Peds  Hematology negative hematology ROS (+)   Anesthesia Other Findings   Reproductive/Obstetrics (+) Pregnancy                             Anesthesia Physical Anesthesia Plan  ASA: III  Anesthesia Plan: Epidural   Post-op Pain Management:    Induction:   PONV Risk Score and Plan:   Airway Management Planned: Natural Airway  Additional Equipment:   Intra-op Plan:   Post-operative Plan:   Informed Consent: I have reviewed the patients History and Physical, chart, labs and discussed the procedure including the risks, benefits and alternatives for the proposed anesthesia with the patient or authorized representative who has indicated his/her understanding and acceptance.     Plan Discussed with: Anesthesiologist  Anesthesia Plan Comments:         Anesthesia Quick Evaluation

## 2018-10-21 NOTE — Anesthesia Procedure Notes (Signed)
Epidural Patient location during procedure: OB Start time: 10/21/2018 8:25 AM End time: 10/21/2018 8:30 AM  Staffing Anesthesiologist: Mal Amabile, MD Performed: anesthesiologist   Preanesthetic Checklist Completed: patient identified, site marked, surgical consent, pre-op evaluation, timeout performed, IV checked, risks and benefits discussed and monitors and equipment checked  Epidural Patient position: sitting Prep: site prepped and draped and DuraPrep Patient monitoring: continuous pulse ox and blood pressure Approach: midline Location: L4-L5 Injection technique: LOR air  Needle:  Needle type: Tuohy  Needle gauge: 17 G Needle length: 9 cm and 9 Needle insertion depth: 8 cm Catheter type: closed end flexible Catheter size: 19 Gauge Catheter at skin depth: 13 cm Test dose: negative and Other  Assessment Events: blood not aspirated, injection not painful, no injection resistance, negative IV test and no paresthesia  Additional Notes Patient identified. Risks and benefits discussed including failed block, incomplete  Pain control, post dural puncture headache, nerve damage, paralysis, blood pressure Changes, nausea, vomiting, reactions to medications-both toxic and allergic and post Partum back pain. All questions were answered. Patient expressed understanding and wished to proceed. Sterile technique was used throughout procedure. Epidural site was Dressed with sterile barrier dressing. No paresthesias, signs of intravascular injection Or signs of intrathecal spread were encountered.  Patient was more comfortable after the epidural was dosed. Please see RN's note for documentation of vital signs and FHR which are stable.

## 2018-10-22 DIAGNOSIS — O09529 Supervision of elderly multigravida, unspecified trimester: Secondary | ICD-10-CM

## 2018-10-22 LAB — CBC
HCT: 31.1 % — ABNORMAL LOW (ref 36.0–46.0)
Hemoglobin: 10.1 g/dL — ABNORMAL LOW (ref 12.0–15.0)
MCH: 28.8 pg (ref 26.0–34.0)
MCHC: 32.5 g/dL (ref 30.0–36.0)
MCV: 88.6 fL (ref 80.0–100.0)
PLATELETS: 193 10*3/uL (ref 150–400)
RBC: 3.51 MIL/uL — ABNORMAL LOW (ref 3.87–5.11)
RDW: 13.7 % (ref 11.5–15.5)
WBC: 9.1 10*3/uL (ref 4.0–10.5)
nRBC: 0 % (ref 0.0–0.2)

## 2018-10-22 MED ORDER — TERBUTALINE SULFATE 1 MG/ML IJ SOLN
0.2500 mg | Freq: Once | INTRAMUSCULAR | Status: DC | PRN
  Filled 2018-10-22: qty 1

## 2018-10-22 MED ORDER — OXYTOCIN BOLUS FROM INFUSION: 500.0000 mL | Freq: Once | INTRAVENOUS | Status: DC

## 2018-10-22 MED ORDER — LACTATED RINGERS IV SOLN: 500.0000 mL | INTRAVENOUS | Status: DC | PRN

## 2018-10-22 MED ORDER — ONDANSETRON HCL 4 MG/2ML IJ SOLN: 4.0000 mg | Freq: Four times a day (QID) | INTRAMUSCULAR | Status: DC | PRN

## 2018-10-22 MED ORDER — OXYTOCIN 40 UNITS IN LACTATED RINGERS INFUSION - SIMPLE MED: 2.5000 [IU]/h | INTRAVENOUS | Status: DC

## 2018-10-22 MED ORDER — OXYTOCIN 40 UNITS IN LACTATED RINGERS INFUSION - SIMPLE MED: 1.0000 m[IU]/min | INTRAVENOUS | Status: DC

## 2018-10-22 MED ORDER — LACTATED RINGERS IV SOLN: INTRAVENOUS | Status: DC

## 2018-10-22 MED ORDER — OXYCODONE-ACETAMINOPHEN 5-325 MG PO TABS: 1.0000 | ORAL_TABLET | ORAL | 0 refills | Status: DC | PRN

## 2018-10-22 MED ORDER — ACETAMINOPHEN 325 MG PO TABS: 650.0000 mg | ORAL_TABLET | ORAL | Status: DC | PRN

## 2018-10-22 MED ORDER — SOD CITRATE-CITRIC ACID 500-334 MG/5ML PO SOLN: 30.0000 mL | ORAL | Status: DC | PRN

## 2018-10-22 MED ORDER — LIDOCAINE HCL (PF) 1 % IJ SOLN
30.0000 mL | INTRAMUSCULAR | Status: DC | PRN
  Filled 2018-10-22: qty 30

## 2018-10-22 MED ORDER — ALBUTEROL SULFATE (2.5 MG/3ML) 0.083% IN NEBU: 3.0000 mL | INHALATION_SOLUTION | RESPIRATORY_TRACT | Status: DC | PRN

## 2018-10-22 NOTE — Progress Notes (Signed)
MOB was referred for history of depression/anxiety. * Referral screened out by Clinical Social Worker because none of the following criteria appear to apply: ~ History of anxiety/depression during this pregnancy, or of post-partum depression following prior delivery. ~ Diagnosis of anxiety and/or depression within last 3 years. No concerns noted in OB records. OR * MOB's symptoms currently being treated with medication and/or therapy.  Please contact the Clinical Social Worker if needs arise, by MOB request, or if MOB scores greater than 9/yes to question 10 on Edinburgh Postpartum Depression Screen.   Kimberely Mccannon Boyd-Gilyard, MSW, LCSW Clinical Social Work [phone]  

## 2018-10-22 NOTE — Anesthesia Postprocedure Evaluation (Signed)
Anesthesia Post Note  Patient: [name]  Procedure(s) Performed: AN AD HOC LABOR EPIDURAL     Patient location during evaluation: Mother Baby Anesthesia Type: Epidural Level of consciousness: awake Pain management: pain level controlled Vital Signs Assessment: post-procedure vital signs reviewed and stable Respiratory status: spontaneous breathing Cardiovascular status: stable Postop Assessment: no backache, epidural receding, patient able to bend at knees and no headache Anesthetic complications: no    Last Vitals:  Vitals:   10/22/18 0142 10/22/18 0441  BP: 137/85 135/82  Pulse: 85 79  Resp:  20  Temp:  37.2 C  SpO2:  97%    Last Pain:  Vitals:   10/22/18 0700  TempSrc:   PainSc: 0-No pain   Pain Goal: Patients Stated Pain Goal: 0 (10/22/18 0442)               Edison Pace

## 2018-10-22 NOTE — Discharge Summary (Signed)
Obstetric Discharge Summary Reason for Admission: rupture of membranes Prenatal Procedures: ultrasound Intrapartum Procedures: spontaneous vaginal delivery Postpartum Procedures: none Complications-Operative and Postpartum: 1st degree perineal laceration Hemoglobin  Date Value Ref Range Status  10/22/2018 10.1 (L) 12.0 - 15.0 g/dL Final   HCT  Date Value Ref Range Status  10/22/2018 31.1 (L) 36.0 - 46.0 % Final    Physical Exam:  General: alert Lochia: appropriate Uterine Fundus: firm   Discharge Diagnoses: Term Pregnancy-delivered and SROM  Discharge Information: Date: 10/22/2018 Activity: pelvic rest Diet: routine Medications: PNV and Percocet Condition: stable Instructions: refer to practice specific booklet Discharge to: home Follow-up Information    Lindsey Reeds, MD. Schedule an appointment as soon as possible for a visit in 1 month(s).   Specialty:  Obstetrics and Gynecology Contact information: [address] [place] [address] [phone]           Newborn Data: Live born female  Birth Weight: 6 lb 14.6 oz (3135 g) APGAR: 9, 9  Newborn Delivery   Birth date/time:  10/21/2018 14:04:00 Delivery type:  Vaginal, Spontaneous     Home with mother.  [name] 10/22/2018, 8:38 AM

## 2018-10-22 NOTE — Progress Notes (Signed)
PPD#1 Pt has no complaints. Would like to go home later today VSSAF IMP/Stable Plan/ Will discharge

## 2018-10-22 NOTE — Lactation Note (Signed)
This note was copied from a baby's chart. Lactation Consultation Note  Patient Name: [name] [id number] Date: 10/22/2018 Reason for consult: Initial assessment;Term;Infant weight loss;Other (Comment)(4% weight loss, per mom gave mom #20 NS and #24 NS / and per mom the #24 NS fits best / mom aware to call with feeding cues )  As LC entered the room , baby STS with mom from a bath and sleeping.  Mom mentioned she has NS #24 and last at the breast there was milk in the NS.  LC reviewed and updated the doc flow sheets per mom , see doc flow sheets.  LC reminded mom she needs to page for feeding cues so the [facility] can assess # NS size.  Sore nipple and engorgement and tx reviewed.  Per mom has DEBP Medela at home.  LC mentioned to mom that when using a NS , recommended to come back for [facility] O/P appt. In 5-7 days. Mom receptive to coming back. Mom requested for [facility] to send a referral to [facility] / [place] for a DEBP [facility] grade ) because it works better for her milk supply. Mom is speaking with experience.  LC stressed STS feedings until the baby can stay awake for a feeding, weight back to birth weight.  Mother informed of post-discharge support and given phone number to the lactation department, including services for phone call assistance; out-patient appointments; and breastfeeding support group. List of other breastfeeding resources in the community given in the handout. Encouraged mother to call for problems or concerns related to breastfeeding.  Mom aware to page  With feeding cues.    Maternal Data Formula Feeding for Exclusion: No Has patient been taught Hand Expression?: Yes Does the patient have breastfeeding experience prior to this delivery?: Yes  Feeding Feeding Type: (per mom curved tip with 7 ML EBM )  LATCH Score                   Interventions Interventions: Breast feeding basics reviewed  Lactation Tools Discussed/Used Tools: Shells;Pump Nipple shield size:  20;24;Other (comment)(per mom using #24 NS ) Shell Type: Inverted Breast pump type: Double-Electric Breast Pump WIC Program: Yes   Consult Status Consult Status: Follow-up Date: 10/22/18(mom aware to page for feeding cues ) Follow-up type: In-patient    Matilde Sprang Tammi Boulier 10/22/2018, 2:02 PM

## 2020-09-29 ENCOUNTER — Encounter: Payer: Self-pay | Admitting: Gastroenterology

## 2020-09-29 ENCOUNTER — Ambulatory Visit (INDEPENDENT_AMBULATORY_CARE_PROVIDER_SITE_OTHER): Payer: 59 | Admitting: Gastroenterology

## 2020-09-29 VITALS — BP 120/82 | HR 88 | Ht 66.0 in | Wt 262.0 lb

## 2020-09-29 DIAGNOSIS — K219 Gastro-esophageal reflux disease without esophagitis: Secondary | ICD-10-CM

## 2020-09-29 MED ORDER — PANTOPRAZOLE SODIUM 40 MG PO TBEC: 40.0000 mg | DELAYED_RELEASE_TABLET | Freq: Every day | ORAL | 11 refills | Status: AC

## 2020-09-29 NOTE — Patient Instructions (Signed)
We have sent the following medications to your pharmacy for you to pick up at your convenience: pantoprazole.  Patient advised to avoid spicy, acidic, citrus, chocolate, mints, fruit and fruit juices.  Limit the intake of caffeine, alcohol and Soda.  Don't exercise too soon after eating.  Don't lie down within 3-4 hours of eating.  Elevate the head of your bed.  You have been scheduled for an endoscopy. Please follow written instructions given to you at your visit today. If you use inhalers (even only as needed), please bring them with you on the day of your procedure.  Thank you for choosing me and [facility].  Malcolm T. Stark, Jr., MD., FACG   

## 2020-09-29 NOTE — Progress Notes (Signed)
History of Present Illness: This is a 39 year old female referred by Lindsey Inch, MD for the evaluation of GERD. S/P Nissen in 2005.  She relates excellent control of her reflux symptoms for many years after her Nissen.  She did note occasional difficulties swallowing bread or meat when she ate really rapidly for many years.  Over the past 6 months she relates worsening problems with frequent throat clearing sore throat in the morning, nausea in the morning and occasional bile emesis in the mornings.  She no longer notes difficulty swallowing breads and meats even when she eats quickly.  She is appropriately concerned her Nissen has slipped. Denies weight loss, abdominal pain, constipation, diarrhea, change in stool caliber, melena, hematochezia, nausea, vomiting, dysphagia, chest pain.     Allergies  Allergen Reactions  . Other Swelling  . Nsaids Hives, Swelling, Other (See Comments) and Itching    Pt states that her lips swelled.    . Sulfa Antibiotics Swelling, Other (See Comments), Hives and Itching    Pt states that her lips swelled.    . Latex Hives, Rash and Itching   Outpatient Medications Prior to Visit  Medication Sig Dispense Refill  . oxyCODONE-acetaminophen (PERCOCET/ROXICET) 5-325 MG tablet Take 1 tablet by mouth every 4 (four) hours as needed (pain scale 4-7). 30 tablet 0   No facility-administered medications prior to visit.   Past Medical History:  Diagnosis Date  . Asthma    seasonal  . Depression   . GERD (gastroesophageal reflux disease)   . LGSIL (low grade squamous intraepithelial lesion) on Pap smear 08/2004  . Migraines   . Vaginal Pap smear, abnormal    Past Surgical History:  Procedure Laterality Date  . CHOLECYSTECTOMY N/A 03/16/2017   Procedure: LAPAROSCOPIC CHOLECYSTECTOMY WITH INTRAOPERATIVE CHOLANGIOGRAM;  Surgeon: Luretha Murphy, MD;  Location: [facility];  Service: General;  Laterality: N/A;  . ERCP N/A 03/17/2017   Procedure: ENDOSCOPIC  RETROGRADE CHOLANGIOPANCREATOGRAPHY (ERCP);  Surgeon: Meryl Dare, MD;  Location: [facility];  Service: Endoscopy;  Laterality: N/A;  . NASAL SINUS SURGERY    . nissenfundo plication  2005   Social History   Socioeconomic History  . Marital status: Married    Spouse name: Not on file  . Number of children: 4  . Years of education: Not on file  . Highest education level: Not on file  Occupational History  . Not on file  Tobacco Use  . Smoking status: Never Smoker  . Smokeless tobacco: Never Used  Vaping Use  . Vaping Use: Never used  Substance and Sexual Activity  . Alcohol use: Not Currently    Alcohol/week: 0.0 standard drinks    Comment: occ  . Drug use: No  . Sexual activity: Yes  Other Topics Concern  . Not on file  Social History Narrative  . Not on file   Social Determinants of Health   Financial Resource Strain:   . Difficulty of Paying Living Expenses: Not on file  Food Insecurity:   . Worried About Programme researcher, broadcasting/film/video in the Last Year: Not on file  . Ran Out of Food in the Last Year: Not on file  Transportation Needs:   . Lack of Transportation (Medical): Not on file  . Lack of Transportation (Non-Medical): Not on file  Physical Activity:   . Days of Exercise per Week: Not on file  . Minutes of Exercise per Session: Not on file  Stress:   . Feeling of  Stress : Not on file  Social Connections:   . Frequency of Communication with Friends and Family: Not on file  . Frequency of Social Gatherings with Friends and Family: Not on file  . Attends Religious Services: Not on file  . Active Member of Clubs or Organizations: Not on file  . Attends Banker Meetings: Not on file  . Marital Status: Not on file   Family History  Problem Relation Age of Onset  . Hypertension Mother   . Hypotension Mother   . Endometriosis Sister   . Cancer Maternal Grandmother   . Heart disease Maternal Grandmother   . Diabetes Maternal Grandfather   . Heart  disease Maternal Grandfather   . Stroke Father   . Hypotension Father       Review of Systems: Pertinent positive and negative review of systems were noted in the above HPI section. All other review of systems were otherwise negative.    Physical Exam: General: Well developed, well nourished, no acute distress Head: Normocephalic and atraumatic Eyes:  sclerae anicteric, EOMI Ears: Normal auditory acuity Mouth: Not examined, mask on during Covid-19 pandemic Neck: Supple, no masses or thyromegaly Lungs: Clear throughout to auscultation Heart: Regular rate and rhythm; no murmurs, rubs or bruits Abdomen: Soft, non tender and non distended. No masses, hepatosplenomegaly or hernias noted. Normal Bowel sounds Rectal: Not done Musculoskeletal: Symmetrical with no gross deformities  Skin: No lesions on visible extremities Pulses:  Normal pulses noted Extremities: No clubbing, cyanosis, edema or deformities noted Neurological: Alert oriented x 4, grossly nonfocal Cervical Nodes:  No significant cervical adenopathy Inguinal Nodes: No significant inguinal adenopathy Psychological:  Alert and cooperative. Normal mood and affect   Assessment and Recommendations:  1. GERD. S/P Nissen in 2005.  Nocturnal reflux symptoms and LPR symptoms for the past 6 months.  Consider slipped Nissen.  Begin pantoprazole 40 mg daily.  Closely follow antireflux measures.  If nocturnal symptoms persists add famotidine at bedtime as next step.  Schedule EGD to further evaluate.  May need upper GI series to follow. The risks (including bleeding, perforation, infection, missed lesions, medication reactions and possible hospitalization or surgery if complications occur), benefits, and alternatives to endoscopy with possible biopsy and possible dilation were discussed with the patient and they consent to proceed.   2. S/P cholecystectomy in 2018. S/P ERCP with sphincterotomy and CBD stone extraction in 2018.   3. CRC  screening, average risk.  Recommend screening colonoscopy at age 56.   cc: Lindsey Inch, MD [address] [place],  [place] [address]

## 2020-10-28 ENCOUNTER — Encounter: Payer: Self-pay | Admitting: Gastroenterology

## 2020-10-28 ENCOUNTER — Other Ambulatory Visit: Payer: Self-pay | Admitting: Gastroenterology

## 2020-10-29 LAB — SARS CORONAVIRUS 2 (TAT 6-24 HRS): SARS Coronavirus 2: NEGATIVE

## 2020-11-01 ENCOUNTER — Other Ambulatory Visit: Payer: Self-pay

## 2020-11-01 ENCOUNTER — Encounter: Payer: Self-pay | Admitting: Gastroenterology

## 2020-11-01 ENCOUNTER — Ambulatory Visit (AMBULATORY_SURGERY_CENTER): Payer: 59 | Admitting: Gastroenterology

## 2020-11-01 VITALS — BP 113/69 | HR 75 | Temp 96.6°F | Resp 14 | Ht 66.0 in | Wt 262.0 lb

## 2020-11-01 DIAGNOSIS — K2970 Gastritis, unspecified, without bleeding: Secondary | ICD-10-CM | POA: Diagnosis not present

## 2020-11-01 DIAGNOSIS — K2950 Unspecified chronic gastritis without bleeding: Secondary | ICD-10-CM | POA: Diagnosis not present

## 2020-11-01 DIAGNOSIS — K317 Polyp of stomach and duodenum: Secondary | ICD-10-CM

## 2020-11-01 DIAGNOSIS — K219 Gastro-esophageal reflux disease without esophagitis: Secondary | ICD-10-CM | POA: Diagnosis not present

## 2020-11-01 MED ORDER — SODIUM CHLORIDE 0.9 % IV SOLN: 500.0000 mL | Freq: Once | INTRAVENOUS | Status: DC

## 2020-11-01 NOTE — Progress Notes (Signed)
A/ox3, pleased with MAC, report to RN 

## 2020-11-01 NOTE — Patient Instructions (Signed)
Handouts given:  Gastritis Resume previous diet Continue current medications Await pathology results UGI series to further assess fundoplication  YOU HAD AN ENDOSCOPIC PROCEDURE TODAY AT THE [facility]:   Refer to the procedure report that was given to you for any specific questions about what was found during the examination.  If the procedure report does not answer your questions, please call your gastroenterologist to clarify.  If you requested that your care partner not be given the details of your procedure findings, then the procedure report has been included in a sealed envelope for you to review at your convenience later.  YOU SHOULD EXPECT: Some feelings of bloating in the abdomen. Passage of more gas than usual.  Walking can help get rid of the air that was put into your GI tract during the procedure and reduce the bloating. If you had a lower endoscopy (such as a colonoscopy or flexible sigmoidoscopy) you may notice spotting of blood in your stool or on the toilet paper. If you underwent a bowel prep for your procedure, you may not have a normal bowel movement for a few days.  Please Note:  You might notice some irritation and congestion in your nose or some drainage.  This is from the oxygen used during your procedure.  There is no need for concern and it should clear up in a day or so.  SYMPTOMS TO REPORT IMMEDIATELY:   Following upper endoscopy (EGD)  Vomiting of blood or coffee ground material  New chest pain or pain under the shoulder blades  Painful or persistently difficult swallowing  New shortness of breath  Fever of 100F or higher  Black, tarry-looking stools  For urgent or emergent issues, a gastroenterologist can be reached at any hour by calling [phone]. Do not use MyChart messaging for urgent concerns.   DIET:  We do recommend a small meal at first, but then you may proceed to your regular diet.  Drink plenty of fluids but you should avoid  alcoholic beverages for 24 hours.  ACTIVITY:  You should plan to take it easy for the rest of today and you should NOT DRIVE or use heavy machinery until tomorrow (because of the sedation medicines used during the test).    FOLLOW UP: Our staff will call the number listed on your records 48-72 hours following your procedure to check on you and address any questions or concerns that you may have regarding the information given to you following your procedure. If we do not reach you, we will leave a message.  We will attempt to reach you two times.  During this call, we will ask if you have developed any symptoms of COVID 19. If you develop any symptoms (ie: fever, flu-like symptoms, shortness of breath, cough etc.) before then, please call [phone].  If you test positive for Covid 19 in the 2 weeks post procedure, please call and report this information to [place].    If any biopsies were taken you will be contacted by phone or by letter within the next 1-3 weeks.  Please call us at [phone] if you have not heard about the biopsies in 3 weeks.   SIGNATURES/CONFIDENTIALITY: You and/or your care partner have signed paperwork which will be entered into your electronic medical record.  These signatures attest to the fact that that the information above on your After Visit Summary has been reviewed and is understood.  Full responsibility of the confidentiality of this discharge information lies with  you and/or your care-partner.

## 2020-11-01 NOTE — Progress Notes (Signed)
Called to room to assist during endoscopic procedure.  Patient ID and intended procedure confirmed with present staff. Received instructions for my participation in the procedure from the performing physician.  

## 2020-11-01 NOTE — Progress Notes (Signed)
Pt's states no medical or surgical changes since previsit or office visit. 

## 2020-11-01 NOTE — Op Note (Signed)
[facility] Patient Name: [name] Procedure Date: Elmore Guise11/15/2021 3:35 PM MRN: [id number] Endoscopist: Meryl DareMalcolm T Kail Fraley , MD Age: 39 Referring MD:  Date of Birth: 07/06/1981 Gender: Female Account #: [id number] Procedure:                Upper GI endoscopy Indications:              Gastro-esophageal reflux disease, Prior Nissen                            fundoplication Medicines:                Monitored Anesthesia Care Procedure:                Pre-Anesthesia Assessment:                           - Prior to the procedure, a History and Physical                            was performed, and patient medications and                            allergies were reviewed. The patient's tolerance of                            previous anesthesia was also reviewed. The risks                            and benefits of the procedure and the sedation                            options and risks were discussed with the patient.                            All questions were answered, and informed consent                            was obtained. Prior Anticoagulants: The patient has                            taken no previous anticoagulant or antiplatelet                            agents. ASA Grade Assessment: II - A patient with                            mild systemic disease. After reviewing the risks                            and benefits, the patient was deemed in                            satisfactory condition to undergo the procedure.  After obtaining informed consent, the endoscope was                            passed under direct vision. Throughout the                            procedure, the patient's blood pressure, pulse, and                            oxygen saturations were monitored continuously. The                            Endoscope was introduced through the mouth, and                            advanced to the second part of duodenum.  The upper                            GI endoscopy was accomplished without difficulty.                            The patient tolerated the procedure well. Scope In: Scope Out: Findings:                 The examined esophagus was normal.                           Patchy mildly erythematous mucosa without bleeding                            was found in the gastric body and in the gastric                            antrum. Biopsies were taken with a cold forceps for                            histology.                           A few small sessile polyps with no bleeding and no                            stigmata of recent bleeding were found in the                            gastric fundus and in the gastric body. Biopsies                            were taken with a cold forceps for histology.                           Evidence of a Nissen fundoplication was found in  the cardia and in the gastric fundus. The wrap                            appeared intact. The GE junction was slightly open.                            This was traversed.                           The exam of the stomach was otherwise normal.                           The duodenal bulb and second portion of the                            duodenum were normal. Complications:            No immediate complications. Estimated Blood Loss:     Estimated blood loss was minimal. Impression:               - Normal esophagus.                           - Erythematous mucosa in the gastric body and                            antrum. Biopsied.                           - A few gastric polyps. Biopsied.                           - A Nissen fundoplication was found. The wrap                            appears intact however the GE junction was slightly                            open.                           - Normal duodenal bulb and second portion of the                             duodenum. Recommendation:           - Patient has a contact number available for                            emergencies. The signs and symptoms of potential                            delayed complications were discussed with the                            patient. Return to normal activities tomorrow.  Written discharge instructions were provided to the                            patient.                           - Resume previous diet.                           - Continue present medications.                           - Await pathology results.                           - UGI series to further assess fundoplication. Meryl Dare, MD 11/01/2020 3:54:37 PM This report has been signed electronically.

## 2020-11-03 ENCOUNTER — Telehealth: Payer: Self-pay | Admitting: *Deleted

## 2020-11-03 NOTE — Telephone Encounter (Signed)
°  Follow up Call-  Call back number 11/01/2020  Post procedure Call Back phone  # [phone]  Permission to leave phone message Yes  Some recent data might be hidden     Patient questions:  Do you have a fever, pain , or abdominal swelling? No. Pain Score  0 *  Have you tolerated food without any problems? Yes.    Have you been able to return to your normal activities? Yes.    Do you have any questions about your discharge instructions: Diet   No. Medications  No. Follow up visit  No.  Do you have questions or concerns about your Care? No.  Actions: * If pain score is 4 or above: No action needed, pain <4.  1. Have you developed a fever since your procedure? no  2.   Have you had an respiratory symptoms (SOB or cough) since your procedure? no  3.   Have you tested positive for COVID 19 since your procedure no  4.   Have you had any family members/close contacts diagnosed with the COVID 19 since your procedure?  no   If yes to any of these questions please route to Laverna Peace, RN and Karlton Lemon, RN

## 2020-11-04 ENCOUNTER — Telehealth: Payer: Self-pay

## 2020-11-04 DIAGNOSIS — K219 Gastro-esophageal reflux disease without esophagitis: Secondary | ICD-10-CM

## 2020-11-04 NOTE — Telephone Encounter (Signed)
Patient notified of the UGI appt for 11/16/20 9:15 arrival for 9:30 at [facility] radiology .  She is asked to be NPO for 3 hours prior

## 2020-11-05 ENCOUNTER — Telehealth: Payer: Self-pay | Admitting: Gastroenterology

## 2020-11-05 NOTE — Telephone Encounter (Signed)
Patient reports she is having nausea no vomiting.   She reports that it was gotten worse this pm.  She is not able to eat.  She is advised to try clear liquids for now until the nausea passes.  She is asked to call back next week if she fails to improve.  She inquired about pathology from procedure on 10/22/20.  She is advised that the pathology results are still pending.

## 2020-11-05 NOTE — Telephone Encounter (Signed)
Patient called states she has been suffering from nausea since her procedure and it is affecting her ability to eat.

## 2020-11-15 ENCOUNTER — Encounter: Payer: Self-pay | Admitting: Gastroenterology

## 2020-11-16 ENCOUNTER — Ambulatory Visit (HOSPITAL_COMMUNITY)
Admission: RE | Admit: 2020-11-16 | Discharge: 2020-11-16 | Disposition: A | Discharge status: Home / Self Care | Payer: 59 | Source: Ambulatory Visit | Attending: Gastroenterology | Admitting: Gastroenterology

## 2020-11-16 ENCOUNTER — Other Ambulatory Visit: Payer: Self-pay

## 2020-11-16 DIAGNOSIS — K219 Gastro-esophageal reflux disease without esophagitis: Secondary | ICD-10-CM | POA: Insufficient documentation

## 2020-12-29 ENCOUNTER — Encounter: Payer: Self-pay | Admitting: Gastroenterology

## 2020-12-29 ENCOUNTER — Ambulatory Visit: Payer: 59 | Admitting: Gastroenterology

## 2020-12-29 VITALS — BP 122/78 | HR 81 | Ht 66.0 in | Wt 264.4 lb

## 2020-12-29 DIAGNOSIS — K219 Gastro-esophageal reflux disease without esophagitis: Secondary | ICD-10-CM | POA: Diagnosis not present

## 2020-12-29 NOTE — Progress Notes (Signed)
    History of Present Illness: This is a 40 year old female with GERD.  Reflux symptoms are now under very good control on antireflux measures and pantoprazole daily.  EGD and UGI series as below.  She states she was under a lot of stress in October with work and has been under more stress recently preparing to move to a new home.  She feels that stress may have exacerbated her reflux symptoms however since her reflux symptoms are well controlled now.  No new gastrointestinal complaints.  EGD 10/2020 - Normal esophagus. - Erythematous mucosa in the gastric body and antrum. Biopsied. (slight chronic inflammation) - A few gastric polyps. Biopsied. (benign fundic gland polyps) - A Nissen fundoplication was found. The wrap appears intact however the GE junction was slightly open. - Normal duodenal bulb and second portion of the duodenum.  UGI series 10/2020 IMPRESSION: 1. Nissen fundoplication appears intact without complicating features. 2. No stricture or GE reflux. 3. Unremarkable appearance of the stomach and duodenum.  Current Medications, Allergies, Past Medical History, Past Surgical History, Family History and Social History were reviewed in [employer] record.   Physical Exam: General: Well developed, well nourished, no acute distress Head: Normocephalic and atraumatic Eyes:  sclerae anicteric, EOMI Ears: Normal auditory acuity Mouth: Not examined, mask on during Covid-19 pandemic Lungs: Clear throughout to auscultation Heart: Regular rate and rhythm; no murmurs, rubs or bruits Abdomen: Soft, non tender and non distended. No masses, hepatosplenomegaly or hernias noted. Normal Bowel sounds Rectal: Not done Musculoskeletal: Symmetrical with no gross deformities  Pulses:  Normal pulses noted Extremities: No clubbing, cyanosis, edema or deformities noted Neurological: Alert oriented x 4, grossly nonfocal Psychological:  Alert and cooperative. Normal mood and  affect   Assessment and Recommendations:  1. GERD. S/P Nissen fundoplication. EGD and UGI series findings reviewed with patient. She has several questions that I addressed to her satisfaction.  She is advised to follow antireflux measures long-term and to remain on pantoprazole 40 mg daily. REV in 1 year.   2.  CRC screening, average risk.  Screening colonoscopy recommended at age 35 in December 2027.  3. S/P cholecystectomy and S/P ERCP sphincterotomy and stone extraction in 2018

## 2020-12-29 NOTE — Patient Instructions (Signed)
Stay on pantoprazole.   Thank you for choosing me and [facility].  Venita Lick. Pleas Koch., MD., Clementeen Graham

## 2021-07-14 IMAGING — RF DG UGI W SINGLE CM
9 of 12 series · 13 of 24 positions shown · non-contrast
Comparison: None.

CLINICAL DATA: Gastroesophageal reflux disease symptoms. History of
prior Nissen fundoplication.

EXAM:
UPPER GI SERIES WITH KUB
TECHNIQUE: After obtaining a scout radiograph a routine upper GI series was
performed using thin density barium
FLUOROSCOPY TIME:  Fluoroscopy Time:  2 minutes and 40 seconds
Radiation Exposure Index (if provided by the fluoroscopic device):
32.7 mGy
Number of Acquired Spot Images: 1

[Series 1: fluoro_barium singleshot_bw · 0.17mm/px · 1 of 1 slices shown]
[im 1/1]
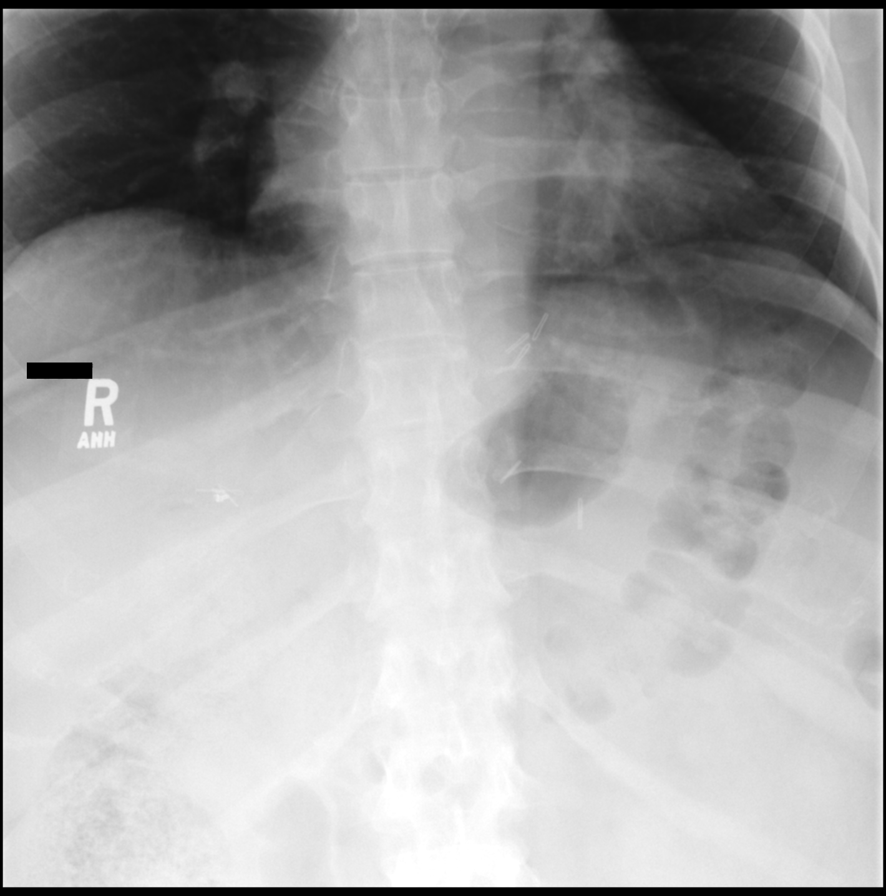

[Series 3: cp_standard · 0.51mm/px · 2 of 15 frames shown (1 of 7)]
[frame 3/15]
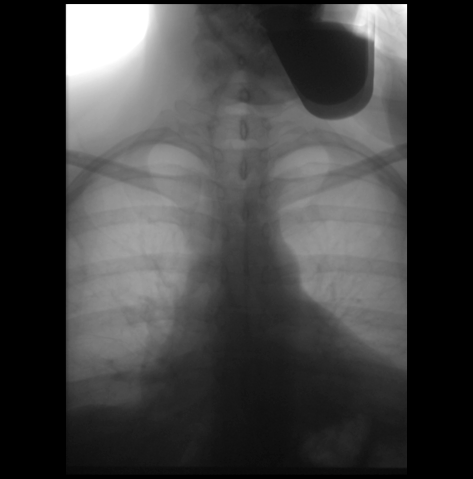
[frame 13/15]
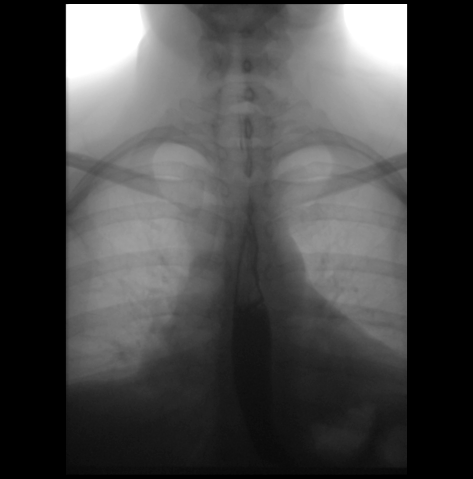

[Series 4: cp_standard · 0.51mm/px · 2 of 17 frames shown (2 of 7)]
[frame 5/17]
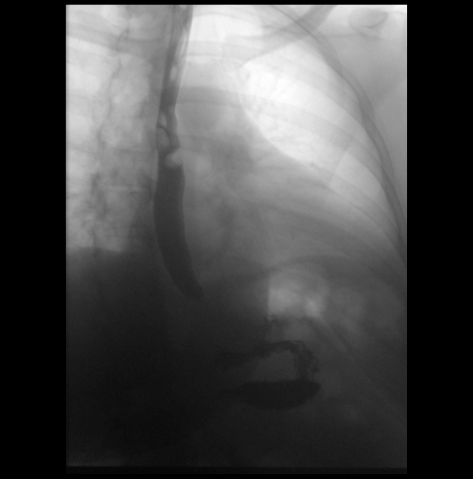
[frame 15/17]
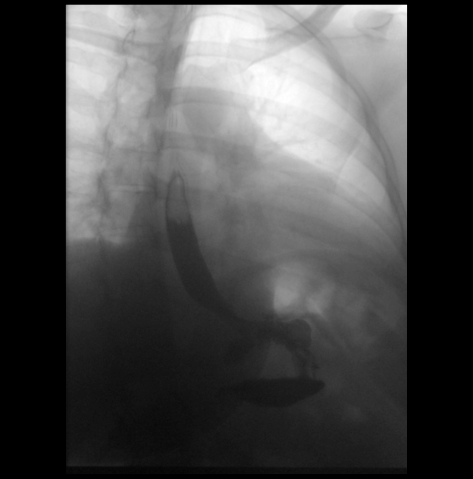

[Series 5: cp_standard · 0.35mm/px · 1 of 25 frames shown (3 of 7)]
[frame 13/25]
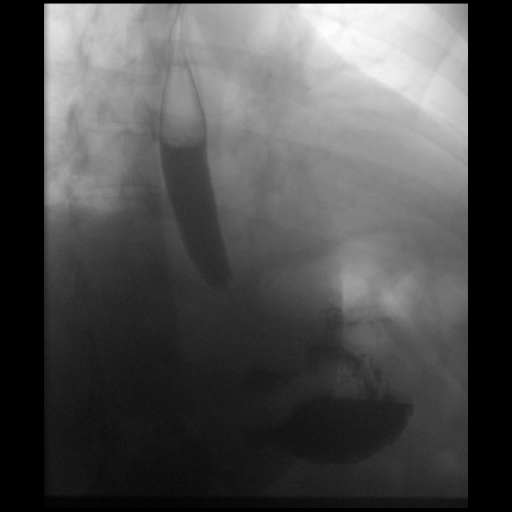

[Series 6: cp_standard · 0.17mm/px · 1 of 1 slices shown (4 of 7)]
[im 1/1]
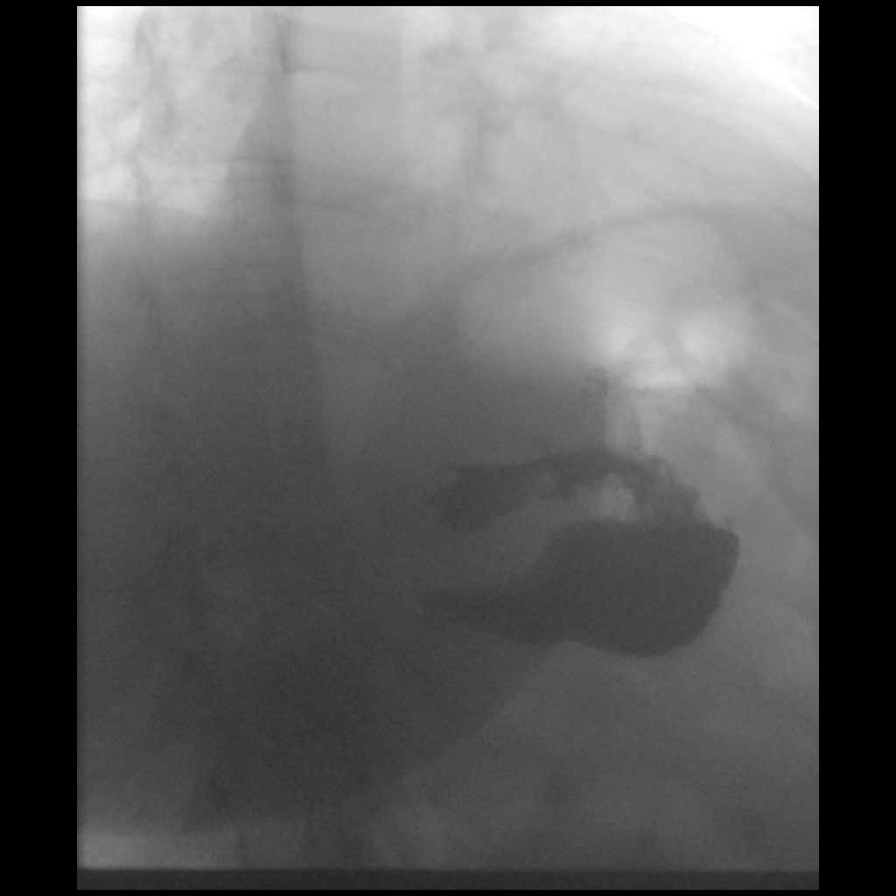

[Series 7: cp_standard · 0.51mm/px · 2 of 32 frames shown (5 of 7)]
[frame 5/32]
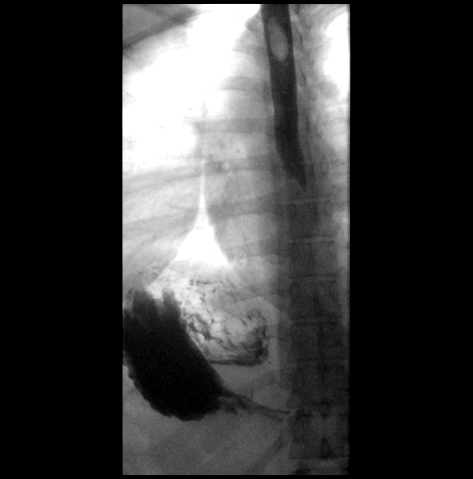
[frame 28/32]
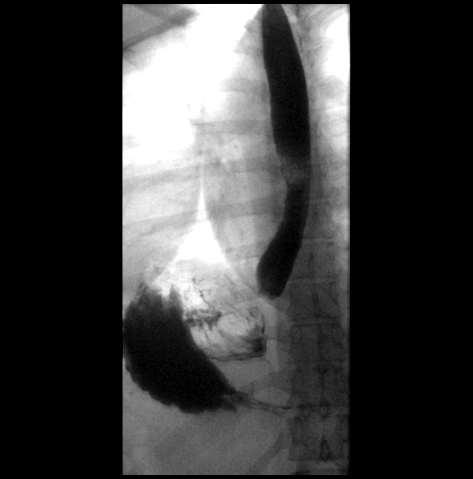

[Series 9: cp_standard · 0.51mm/px · 2 of 19 frames shown (6 of 7)]
[frame 3/19]
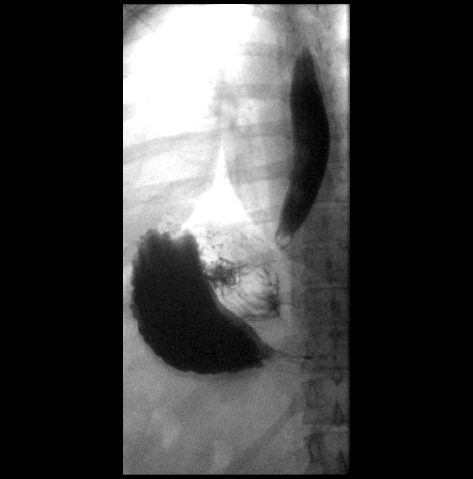
[frame 17/19]
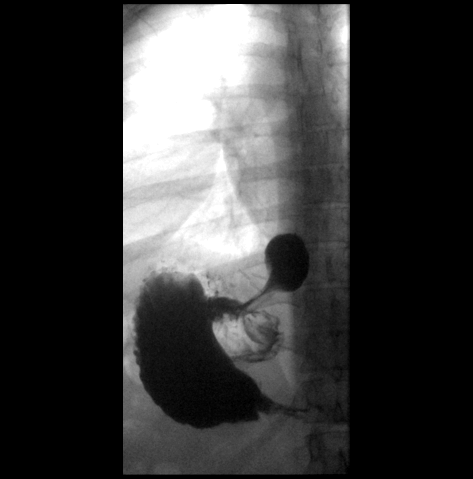

[Series 11: cp_standard · 0.17mm/px · 1 of 1 slices shown (7 of 7)]
[im 1/1]
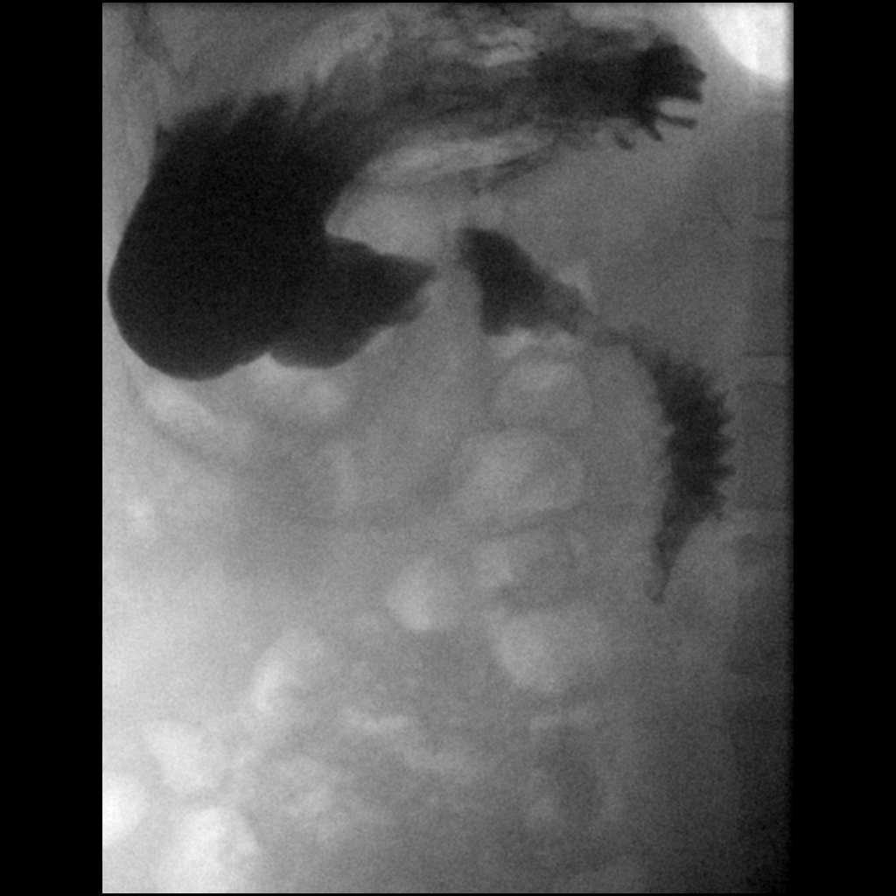

[Series 13: fluoro_barium 2fps_bw · 0.17mm/px · 1 of 1 slices shown]
[im 1/1]
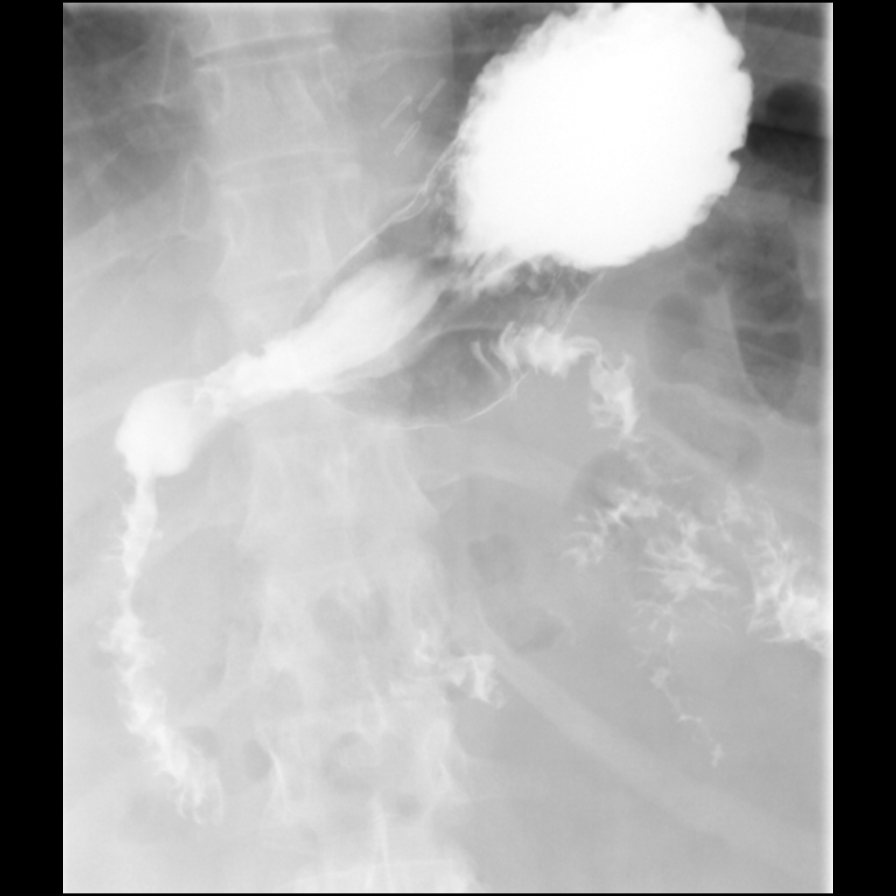

[13 of 24 positions shown; findings below may reference images not displayed]

FINDINGS: Initial barium swallows demonstrate normal esophageal motility. No
intrinsic or extrinsic lesions of the esophagus were identified. The
Oros wrap is intact. No evidence of slippage. Normal caliber
esophagus through the Oros without evidence of significant
narrowing. No esophageal lesions or strictures. No GE reflux was
demonstrated.

The stomach, duodenal bulb and C-loop appear normal. Normal position
of the duodenal jejunal junction.
IMPRESSION: 1. Nissen fundoplication appears intact without complicating
features.
2. No stricture or GE reflux.
3. Unremarkable appearance of the stomach and duodenum.
